# Patient Record
Sex: Female | Born: 1955 | Smoking: Never smoker
Health system: Southern US, Community
[De-identification: ages and names within clinical notes are randomized; demographics above are authoritative.]

## PROBLEM LIST (undated history)

## (undated) DIAGNOSIS — K219 Gastro-esophageal reflux disease without esophagitis: Secondary | ICD-10-CM

## (undated) DIAGNOSIS — D649 Anemia, unspecified: Secondary | ICD-10-CM

---

## 1980-06-20 HISTORY — PX: OTHER SURGICAL HISTORY: SHX169

## 1985-12-21 HISTORY — PX: TUBAL LIGATION: SHX77

## 2012-06-20 HISTORY — PX: LEG SURGERY: SHX1003

## 2021-03-20 DIAGNOSIS — R609 Edema, unspecified: Secondary | ICD-10-CM | POA: Diagnosis not present

## 2021-03-20 DIAGNOSIS — S82142A Displaced bicondylar fracture of left tibia, initial encounter for closed fracture: Secondary | ICD-10-CM | POA: Diagnosis not present

## 2021-03-20 DIAGNOSIS — S8992XA Unspecified injury of left lower leg, initial encounter: Secondary | ICD-10-CM | POA: Diagnosis not present

## 2021-03-20 DIAGNOSIS — S82832A Other fracture of upper and lower end of left fibula, initial encounter for closed fracture: Secondary | ICD-10-CM | POA: Diagnosis not present

## 2021-03-22 DIAGNOSIS — S82142A Displaced bicondylar fracture of left tibia, initial encounter for closed fracture: Secondary | ICD-10-CM | POA: Diagnosis not present

## 2021-03-30 ENCOUNTER — Encounter (HOSPITAL_COMMUNITY): Payer: Self-pay | Admitting: Orthopedic Surgery

## 2021-03-30 ENCOUNTER — Other Ambulatory Visit: Payer: Self-pay

## 2021-03-30 DIAGNOSIS — S82145A Nondisplaced bicondylar fracture of left tibia, initial encounter for closed fracture: Secondary | ICD-10-CM | POA: Diagnosis not present

## 2021-03-30 NOTE — Progress Notes (Signed)
DUE TO COVID-19 ONLY ONE VISITOR IS ALLOWED TO COME WITH YOU AND STAY IN THE WAITING ROOM ONLY DURING PRE OP AND PROCEDURE DAY OF SURGERY.   Two VISITORS MAY VISIT WITH YOU AFTER SURGERY IN YOUR PRIVATE ROOM DURING VISITING HOURS ONLY!  PCP - none Cardiologist - n/a  Chest x-ray - n/a EKG - n/a Stress Test - n/a ECHO - n/a Cardiac Cath - n/a  ICD Pacemaker/Loop - n/a  Sleep Study -  n/a CPAP - none  Aspirin Instructions: Follow your surgeon's instructions on when to stop aspirin prior to surgery,  If no instructions were given by your surgeon then you will need to call the office for those instructions.  STOP now taking any Aspirin (unless otherwise instructed by your surgeon), Aleve, Naproxen, Ibuprofen, Motrin, Advil, Goody's, BC's, all herbal medications, fish oil, and all vitamins.   Coronavirus Screening Covid test is scheduled on DOS Do you have any of the following symptoms:  Cough yes/no: No Fever (>100.34F)  yes/no: No Runny nose yes/no: No Sore throat yes/no: No Difficulty breathing/shortness of breath  yes/no: No  Have you traveled in the last 14 days and where? yes/no: No  Patient verbalized understanding of instructions that were given via phone.

## 2021-03-31 ENCOUNTER — Inpatient Hospital Stay (HOSPITAL_COMMUNITY): Payer: PPO

## 2021-03-31 ENCOUNTER — Encounter (HOSPITAL_COMMUNITY): Payer: Self-pay | Admitting: Orthopedic Surgery

## 2021-03-31 ENCOUNTER — Encounter (HOSPITAL_COMMUNITY)
Admission: RE | Disposition: A | Discharge status: Home Health | Payer: Self-pay | Source: Home / Self Care | Attending: Orthopedic Surgery

## 2021-03-31 ENCOUNTER — Inpatient Hospital Stay (HOSPITAL_COMMUNITY): Payer: PPO | Admitting: Certified Registered Nurse Anesthetist

## 2021-03-31 ENCOUNTER — Inpatient Hospital Stay (HOSPITAL_COMMUNITY)
Admission: RE | Admit: 2021-03-31 | Discharge: 2021-04-01 | DRG: 488 | Disposition: A | Discharge status: Home Health | Payer: PPO | Attending: Orthopedic Surgery | Admitting: Orthopedic Surgery

## 2021-03-31 ENCOUNTER — Other Ambulatory Visit: Payer: Self-pay

## 2021-03-31 DIAGNOSIS — Z91013 Allergy to seafood: Secondary | ICD-10-CM

## 2021-03-31 DIAGNOSIS — T148XXA Other injury of unspecified body region, initial encounter: Secondary | ICD-10-CM

## 2021-03-31 DIAGNOSIS — Z7982 Long term (current) use of aspirin: Secondary | ICD-10-CM | POA: Diagnosis not present

## 2021-03-31 DIAGNOSIS — Z79899 Other long term (current) drug therapy: Secondary | ICD-10-CM

## 2021-03-31 DIAGNOSIS — M80862A Other osteoporosis with current pathological fracture, left lower leg, initial encounter for fracture: Principal | ICD-10-CM | POA: Diagnosis present

## 2021-03-31 DIAGNOSIS — S82142D Displaced bicondylar fracture of left tibia, subsequent encounter for closed fracture with routine healing: Secondary | ICD-10-CM | POA: Diagnosis not present

## 2021-03-31 DIAGNOSIS — D62 Acute posthemorrhagic anemia: Secondary | ICD-10-CM | POA: Diagnosis not present

## 2021-03-31 DIAGNOSIS — M170 Bilateral primary osteoarthritis of knee: Secondary | ICD-10-CM | POA: Diagnosis present

## 2021-03-31 DIAGNOSIS — S83262A Peripheral tear of lateral meniscus, current injury, left knee, initial encounter: Secondary | ICD-10-CM | POA: Diagnosis present

## 2021-03-31 DIAGNOSIS — Z8262 Family history of osteoporosis: Secondary | ICD-10-CM

## 2021-03-31 DIAGNOSIS — S83282A Other tear of lateral meniscus, current injury, left knee, initial encounter: Secondary | ICD-10-CM | POA: Diagnosis not present

## 2021-03-31 DIAGNOSIS — S82142A Displaced bicondylar fracture of left tibia, initial encounter for closed fracture: Secondary | ICD-10-CM

## 2021-03-31 DIAGNOSIS — S82143A Displaced bicondylar fracture of unspecified tibia, initial encounter for closed fracture: Secondary | ICD-10-CM | POA: Diagnosis present

## 2021-03-31 DIAGNOSIS — M80062A Age-related osteoporosis with current pathological fracture, left lower leg, initial encounter for fracture: Secondary | ICD-10-CM

## 2021-03-31 DIAGNOSIS — S82402D Unspecified fracture of shaft of left fibula, subsequent encounter for closed fracture with routine healing: Secondary | ICD-10-CM | POA: Diagnosis not present

## 2021-03-31 DIAGNOSIS — K219 Gastro-esophageal reflux disease without esophagitis: Secondary | ICD-10-CM | POA: Diagnosis not present

## 2021-03-31 DIAGNOSIS — S82122A Displaced fracture of lateral condyle of left tibia, initial encounter for closed fracture: Secondary | ICD-10-CM | POA: Diagnosis not present

## 2021-03-31 DIAGNOSIS — Z20822 Contact with and (suspected) exposure to covid-19: Secondary | ICD-10-CM | POA: Diagnosis not present

## 2021-03-31 DIAGNOSIS — Z882 Allergy status to sulfonamides status: Secondary | ICD-10-CM

## 2021-03-31 DIAGNOSIS — S82112A Displaced fracture of left tibial spine, initial encounter for closed fracture: Secondary | ICD-10-CM | POA: Diagnosis present

## 2021-03-31 DIAGNOSIS — Z885 Allergy status to narcotic agent status: Secondary | ICD-10-CM | POA: Diagnosis not present

## 2021-03-31 DIAGNOSIS — W1830XA Fall on same level, unspecified, initial encounter: Secondary | ICD-10-CM | POA: Diagnosis present

## 2021-03-31 DIAGNOSIS — M8080XA Other osteoporosis with current pathological fracture, unspecified site, initial encounter for fracture: Secondary | ICD-10-CM | POA: Diagnosis present

## 2021-03-31 DIAGNOSIS — Z419 Encounter for procedure for purposes other than remedying health state, unspecified: Secondary | ICD-10-CM

## 2021-03-31 HISTORY — DX: Gastro-esophageal reflux disease without esophagitis: K21.9

## 2021-03-31 HISTORY — DX: Anemia, unspecified: D64.9

## 2021-03-31 HISTORY — PX: ORIF TIBIA PLATEAU: SHX2132

## 2021-03-31 LAB — CBC WITH DIFFERENTIAL/PLATELET
Abs Immature Granulocytes: 0.03 10*3/uL (ref 0.00–0.07)
Basophils Absolute: 0.1 10*3/uL (ref 0.0–0.1)
Basophils Relative: 1 %
Eosinophils Absolute: 0.1 10*3/uL (ref 0.0–0.5)
Eosinophils Relative: 1 %
HCT: 39.4 % (ref 36.0–46.0)
Hemoglobin: 12.6 g/dL (ref 12.0–15.0)
Immature Granulocytes: 0 %
Lymphocytes Relative: 17 %
Lymphs Abs: 1.6 10*3/uL (ref 0.7–4.0)
MCH: 30.9 pg (ref 26.0–34.0)
MCHC: 32 g/dL (ref 30.0–36.0)
MCV: 96.6 fL (ref 80.0–100.0)
Monocytes Absolute: 0.7 10*3/uL (ref 0.1–1.0)
Monocytes Relative: 7 %
Neutro Abs: 7.3 10*3/uL (ref 1.7–7.7)
Neutrophils Relative %: 74 %
Platelets: 391 10*3/uL (ref 150–400)
RBC: 4.08 MIL/uL (ref 3.87–5.11)
RDW: 13.7 % (ref 11.5–15.5)
WBC: 9.7 10*3/uL (ref 4.0–10.5)
nRBC: 0 % (ref 0.0–0.2)

## 2021-03-31 LAB — PREALBUMIN: Prealbumin: 24 mg/dL (ref 18–38)

## 2021-03-31 LAB — COMPREHENSIVE METABOLIC PANEL
ALT: 33 U/L (ref 0–44)
AST: 27 U/L (ref 15–41)
Albumin: 3.4 g/dL — ABNORMAL LOW (ref 3.5–5.0)
Alkaline Phosphatase: 99 U/L (ref 38–126)
Anion gap: 10 (ref 5–15)
BUN: 7 mg/dL — ABNORMAL LOW (ref 8–23)
CO2: 29 mmol/L (ref 22–32)
Calcium: 9.3 mg/dL (ref 8.9–10.3)
Chloride: 104 mmol/L (ref 98–111)
Creatinine, Ser: 0.67 mg/dL (ref 0.44–1.00)
GFR, Estimated: >60 mL/min (ref >60)
Glucose, Bld: 115 mg/dL — ABNORMAL HIGH (ref 70–99)
Potassium: 3.5 mmol/L (ref 3.5–5.1)
Sodium: 143 mmol/L (ref 135–145)
Total Bilirubin: 0.7 mg/dL (ref 0.3–1.2)
Total Protein: 7.1 g/dL (ref 6.5–8.1)

## 2021-03-31 LAB — TSH: TSH: 2.564 u[IU]/mL (ref 0.350–4.500)

## 2021-03-31 LAB — PHOSPHORUS: Phosphorus: 3.3 mg/dL (ref 2.5–4.6)

## 2021-03-31 LAB — MAGNESIUM: Magnesium: 2.2 mg/dL (ref 1.7–2.4)

## 2021-03-31 LAB — SARS CORONAVIRUS 2 BY RT PCR (HOSPITAL ORDER, PERFORMED IN ~~LOC~~ HOSPITAL LAB): SARS Coronavirus 2: NEGATIVE

## 2021-03-31 LAB — VITAMIN D 25 HYDROXY (VIT D DEFICIENCY, FRACTURES): Vit D, 25-Hydroxy: 43.83 ng/mL (ref 30–100)

## 2021-03-31 SURGERY — OPEN REDUCTION INTERNAL FIXATION (ORIF) TIBIAL PLATEAU
Anesthesia: General | Site: Leg Lower | Laterality: Left

## 2021-03-31 MED ORDER — SENNOSIDES-DOCUSATE SODIUM 8.6-50 MG PO TABS: 1.0000 | ORAL_TABLET | Freq: Every evening | ORAL | Status: DC | PRN

## 2021-03-31 MED ORDER — EPHEDRINE 5 MG/ML INJ
INTRAVENOUS | Status: AC
  Filled 2021-03-31: qty 5

## 2021-03-31 MED ORDER — METHOCARBAMOL 750 MG PO TABS
750.0000 mg | ORAL_TABLET | Freq: Three times a day (TID) | ORAL | Status: DC
  Administered 2021-03-31 – 2021-04-01 (×3): 750 mg via ORAL
  Filled 2021-03-31 (×3): qty 1

## 2021-03-31 MED ORDER — METOCLOPRAMIDE HCL 5 MG/ML IJ SOLN: 5.0000 mg | Freq: Three times a day (TID) | INTRAMUSCULAR | Status: DC | PRN

## 2021-03-31 MED ORDER — POTASSIUM CHLORIDE IN NACL 20-0.9 MEQ/L-% IV SOLN
INTRAVENOUS | Status: DC
  Filled 2021-03-31: qty 1000

## 2021-03-31 MED ORDER — SORBITOL 70 % SOLN
30.0000 mL | Freq: Every day | Status: DC | PRN
  Filled 2021-03-31: qty 30

## 2021-03-31 MED ORDER — ROCURONIUM BROMIDE 10 MG/ML (PF) SYRINGE
PREFILLED_SYRINGE | INTRAVENOUS | Status: DC | PRN
  Administered 2021-03-31: 70 mg via INTRAVENOUS

## 2021-03-31 MED ORDER — ENOXAPARIN SODIUM 40 MG/0.4ML IJ SOSY
40.0000 mg | PREFILLED_SYRINGE | INTRAMUSCULAR | Status: DC
  Administered 2021-04-01: 40 mg via SUBCUTANEOUS
  Filled 2021-03-31: qty 0.4

## 2021-03-31 MED ORDER — VITAMIN D 25 MCG (1000 UNIT) PO TABS
2000.0000 [IU] | ORAL_TABLET | Freq: Two times a day (BID) | ORAL | Status: DC
  Administered 2021-03-31 – 2021-04-01 (×3): 2000 [IU] via ORAL
  Filled 2021-03-31 (×3): qty 2

## 2021-03-31 MED ORDER — CEFAZOLIN SODIUM-DEXTROSE 2-4 GM/100ML-% IV SOLN
2.0000 g | INTRAVENOUS | Status: AC
  Administered 2021-03-31: 2 g via INTRAVENOUS
  Filled 2021-03-31: qty 100

## 2021-03-31 MED ORDER — HYDROCODONE-ACETAMINOPHEN 7.5-325 MG PO TABS: 1.0000 | ORAL_TABLET | ORAL | Status: DC | PRN

## 2021-03-31 MED ORDER — PHENYLEPHRINE 40 MCG/ML (10ML) SYRINGE FOR IV PUSH (FOR BLOOD PRESSURE SUPPORT)
PREFILLED_SYRINGE | INTRAVENOUS | Status: DC | PRN
  Administered 2021-03-31: 120 ug via INTRAVENOUS

## 2021-03-31 MED ORDER — METOCLOPRAMIDE HCL 5 MG PO TABS: 5.0000 mg | ORAL_TABLET | Freq: Three times a day (TID) | ORAL | Status: DC | PRN

## 2021-03-31 MED ORDER — ACETAMINOPHEN 500 MG PO TABS
1000.0000 mg | ORAL_TABLET | Freq: Once | ORAL | Status: AC
  Administered 2021-03-31: 1000 mg via ORAL
  Filled 2021-03-31: qty 2

## 2021-03-31 MED ORDER — HYDROCODONE-ACETAMINOPHEN 5-325 MG PO TABS
1.0000 | ORAL_TABLET | ORAL | Status: DC | PRN
  Administered 2021-03-31 – 2021-04-01 (×2): 2 via ORAL
  Filled 2021-03-31 (×2): qty 2

## 2021-03-31 MED ORDER — DEXAMETHASONE SODIUM PHOSPHATE 10 MG/ML IJ SOLN
INTRAMUSCULAR | Status: AC
  Filled 2021-03-31: qty 1

## 2021-03-31 MED ORDER — ASCORBIC ACID 500 MG PO TABS
500.0000 mg | ORAL_TABLET | Freq: Every day | ORAL | Status: DC
  Administered 2021-03-31 – 2021-04-01 (×2): 500 mg via ORAL
  Filled 2021-03-31 (×2): qty 1

## 2021-03-31 MED ORDER — ALUM & MAG HYDROXIDE-SIMETH 200-200-20 MG/5ML PO SUSP: 30.0000 mL | Freq: Four times a day (QID) | ORAL | Status: DC | PRN

## 2021-03-31 MED ORDER — LIDOCAINE 2% (20 MG/ML) 5 ML SYRINGE
INTRAMUSCULAR | Status: DC | PRN
  Administered 2021-03-31: 80 mg via INTRAVENOUS

## 2021-03-31 MED ORDER — FENTANYL CITRATE (PF) 250 MCG/5ML IJ SOLN
INTRAMUSCULAR | Status: DC | PRN
  Administered 2021-03-31: 50 ug via INTRAVENOUS
  Administered 2021-03-31: 100 ug via INTRAVENOUS
  Administered 2021-03-31 (×2): 50 ug via INTRAVENOUS

## 2021-03-31 MED ORDER — LACTATED RINGERS IV SOLN: INTRAVENOUS | Status: DC

## 2021-03-31 MED ORDER — PROMETHAZINE HCL 25 MG/ML IJ SOLN: 6.2500 mg | INTRAMUSCULAR | Status: DC | PRN

## 2021-03-31 MED ORDER — FENTANYL CITRATE (PF) 100 MCG/2ML IJ SOLN
25.0000 ug | INTRAMUSCULAR | Status: DC | PRN
  Administered 2021-03-31 (×2): 25 ug via INTRAVENOUS
  Administered 2021-03-31 (×2): 50 ug via INTRAVENOUS

## 2021-03-31 MED ORDER — METHOCARBAMOL 1000 MG/10ML IJ SOLN
500.0000 mg | Freq: Three times a day (TID) | INTRAVENOUS | Status: DC
  Filled 2021-03-31: qty 5

## 2021-03-31 MED ORDER — PROPOFOL 10 MG/ML IV BOLUS
INTRAVENOUS | Status: DC | PRN
  Administered 2021-03-31: 150 mg via INTRAVENOUS

## 2021-03-31 MED ORDER — ROCURONIUM BROMIDE 10 MG/ML (PF) SYRINGE
PREFILLED_SYRINGE | INTRAVENOUS | Status: AC
  Filled 2021-03-31: qty 10

## 2021-03-31 MED ORDER — ZINC SULFATE 220 (50 ZN) MG PO CAPS
220.0000 mg | ORAL_CAPSULE | Freq: Every day | ORAL | Status: DC
  Administered 2021-03-31 – 2021-04-01 (×2): 220 mg via ORAL
  Filled 2021-03-31 (×2): qty 1

## 2021-03-31 MED ORDER — ONDANSETRON HCL 4 MG/2ML IJ SOLN: 4.0000 mg | Freq: Four times a day (QID) | INTRAMUSCULAR | Status: DC | PRN

## 2021-03-31 MED ORDER — SUGAMMADEX SODIUM 200 MG/2ML IV SOLN
INTRAVENOUS | Status: DC | PRN
  Administered 2021-03-31: 199.4 mg via INTRAVENOUS

## 2021-03-31 MED ORDER — ACETAMINOPHEN 325 MG PO TABS: 325.0000 mg | ORAL_TABLET | Freq: Four times a day (QID) | ORAL | Status: DC | PRN

## 2021-03-31 MED ORDER — ONDANSETRON HCL 4 MG PO TABS: 4.0000 mg | ORAL_TABLET | Freq: Four times a day (QID) | ORAL | Status: DC | PRN

## 2021-03-31 MED ORDER — EPHEDRINE SULFATE-NACL 50-0.9 MG/10ML-% IV SOSY
PREFILLED_SYRINGE | INTRAVENOUS | Status: DC | PRN
  Administered 2021-03-31: 10 mg via INTRAVENOUS

## 2021-03-31 MED ORDER — ONDANSETRON HCL 4 MG/2ML IJ SOLN
INTRAMUSCULAR | Status: AC
  Filled 2021-03-31: qty 2

## 2021-03-31 MED ORDER — FENTANYL CITRATE (PF) 100 MCG/2ML IJ SOLN
INTRAMUSCULAR | Status: AC
  Filled 2021-03-31: qty 2

## 2021-03-31 MED ORDER — ADULT MULTIVITAMIN W/MINERALS CH
1.0000 | ORAL_TABLET | Freq: Every day | ORAL | Status: DC
  Administered 2021-03-31 – 2021-04-01 (×2): 1 via ORAL
  Filled 2021-03-31 (×2): qty 1

## 2021-03-31 MED ORDER — ASPIRIN EC 81 MG PO TBEC
81.0000 mg | DELAYED_RELEASE_TABLET | Freq: Every day | ORAL | Status: DC
  Administered 2021-03-31 – 2021-04-01 (×2): 81 mg via ORAL
  Filled 2021-03-31 (×2): qty 1

## 2021-03-31 MED ORDER — FENTANYL CITRATE (PF) 250 MCG/5ML IJ SOLN
INTRAMUSCULAR | Status: AC
  Filled 2021-03-31: qty 5

## 2021-03-31 MED ORDER — ONDANSETRON HCL 4 MG/2ML IJ SOLN
INTRAMUSCULAR | Status: DC | PRN
Start: 2021-03-31 — End: 2021-03-31
  Administered 2021-03-31: 4 mg via INTRAVENOUS

## 2021-03-31 MED ORDER — ACETAMINOPHEN 500 MG PO TABS
500.0000 mg | ORAL_TABLET | Freq: Two times a day (BID) | ORAL | Status: DC
  Administered 2021-03-31 – 2021-04-01 (×2): 500 mg via ORAL
  Filled 2021-03-31 (×3): qty 1

## 2021-03-31 MED ORDER — AMISULPRIDE (ANTIEMETIC) 5 MG/2ML IV SOLN
10.0000 mg | Freq: Once | INTRAVENOUS | Status: AC | PRN
  Administered 2021-03-31: 10 mg via INTRAVENOUS

## 2021-03-31 MED ORDER — MAGNESIUM HYDROXIDE 400 MG/5ML PO SUSP
30.0000 mL | Freq: Every day | ORAL | Status: DC | PRN
  Filled 2021-03-31: qty 30

## 2021-03-31 MED ORDER — PHENYLEPHRINE HCL-NACL 20-0.9 MG/250ML-% IV SOLN
INTRAVENOUS | Status: DC | PRN
  Administered 2021-03-31: 25 ug/min via INTRAVENOUS

## 2021-03-31 MED ORDER — CHLORHEXIDINE GLUCONATE 0.12 % MT SOLN
OROMUCOSAL | Status: AC
  Administered 2021-03-31: 15 mL
  Filled 2021-03-31: qty 15

## 2021-03-31 MED ORDER — MORPHINE SULFATE (PF) 2 MG/ML IV SOLN: 0.5000 mg | INTRAVENOUS | Status: DC | PRN

## 2021-03-31 MED ORDER — LIDOCAINE 2% (20 MG/ML) 5 ML SYRINGE
INTRAMUSCULAR | Status: AC
  Filled 2021-03-31: qty 5

## 2021-03-31 MED ORDER — 0.9 % SODIUM CHLORIDE (POUR BTL) OPTIME
TOPICAL | Status: DC | PRN
  Administered 2021-03-31: 1000 mL

## 2021-03-31 MED ORDER — DOCUSATE SODIUM 100 MG PO CAPS
100.0000 mg | ORAL_CAPSULE | Freq: Two times a day (BID) | ORAL | Status: DC
  Filled 2021-03-31 (×2): qty 1

## 2021-03-31 MED ORDER — CEFAZOLIN SODIUM-DEXTROSE 1-4 GM/50ML-% IV SOLN
1.0000 g | Freq: Four times a day (QID) | INTRAVENOUS | Status: AC
  Administered 2021-03-31 – 2021-04-01 (×3): 1 g via INTRAVENOUS
  Filled 2021-03-31 (×3): qty 50

## 2021-03-31 MED ORDER — DEXAMETHASONE SODIUM PHOSPHATE 10 MG/ML IJ SOLN
INTRAMUSCULAR | Status: DC | PRN
Start: 2021-03-31 — End: 2021-03-31
  Administered 2021-03-31: 8 mg via INTRAVENOUS

## 2021-03-31 MED ORDER — AMISULPRIDE (ANTIEMETIC) 5 MG/2ML IV SOLN
INTRAVENOUS | Status: AC
  Filled 2021-03-31: qty 4

## 2021-03-31 MED ORDER — PHENYLEPHRINE 40 MCG/ML (10ML) SYRINGE FOR IV PUSH (FOR BLOOD PRESSURE SUPPORT)
PREFILLED_SYRINGE | INTRAVENOUS | Status: AC
  Filled 2021-03-31: qty 10

## 2021-03-31 SURGICAL SUPPLY — 96 items
BAG COUNTER SPONGE SURGICOUNT (BAG) ×2 IMPLANT
BANDAGE ESMARK 6X9 LF (GAUZE/BANDAGES/DRESSINGS) ×1 IMPLANT
BIT DRILL 100X2.5XANTM LCK (BIT) IMPLANT
BIT DRILL CAL (BIT) IMPLANT
BIT DRL 100X2.5XANTM LCK (BIT) ×1
BLADE CLIPPER SURG (BLADE) IMPLANT
BLADE SURG 10 STRL SS (BLADE) ×2 IMPLANT
BLADE SURG 15 STRL LF DISP TIS (BLADE) ×1 IMPLANT
BLADE SURG 15 STRL SS (BLADE) ×1
BNDG COHESIVE 4X5 TAN STRL (GAUZE/BANDAGES/DRESSINGS) ×2 IMPLANT
BNDG ELASTIC 4X5.8 VLCR STR LF (GAUZE/BANDAGES/DRESSINGS) ×2 IMPLANT
BNDG ELASTIC 6X5.8 VLCR STR LF (GAUZE/BANDAGES/DRESSINGS) ×2 IMPLANT
BNDG ESMARK 6X9 LF (GAUZE/BANDAGES/DRESSINGS) ×2
BNDG GAUZE ELAST 4 BULKY (GAUZE/BANDAGES/DRESSINGS) ×2 IMPLANT
BONE CANC CHIPS 20CC PCAN1/4 (Bone Implant) ×2 IMPLANT
BRUSH SCRUB EZ PLAIN DRY (MISCELLANEOUS) ×4 IMPLANT
CANISTER SUCT 3000ML PPV (MISCELLANEOUS) ×2 IMPLANT
CHIPS CANC BONE 20CC PCAN1/4 (Bone Implant) ×1 IMPLANT
COVER SURGICAL LIGHT HANDLE (MISCELLANEOUS) ×2 IMPLANT
CUFF TOURN SGL QUICK 34 (TOURNIQUET CUFF) ×1
CUFF TRNQT CYL 34X4.125X (TOURNIQUET CUFF) ×1 IMPLANT
DRAPE C-ARM 42X72 X-RAY (DRAPES) ×2 IMPLANT
DRAPE C-ARMOR (DRAPES) ×2 IMPLANT
DRAPE HALF SHEET 40X57 (DRAPES) IMPLANT
DRAPE INCISE IOBAN 66X45 STRL (DRAPES) ×2 IMPLANT
DRAPE U-SHAPE 47X51 STRL (DRAPES) ×2 IMPLANT
DRESSING MEPILEX FLEX 4X4 (GAUZE/BANDAGES/DRESSINGS) IMPLANT
DRILL BIT 2.5MM (BIT) ×1
DRILL BIT CAL (BIT) ×2
DRSG ADAPTIC 3X8 NADH LF (GAUZE/BANDAGES/DRESSINGS) ×2 IMPLANT
DRSG MEPILEX BORDER 4X8 (GAUZE/BANDAGES/DRESSINGS) ×1 IMPLANT
DRSG MEPILEX FLEX 4X4 (GAUZE/BANDAGES/DRESSINGS) ×2
DRSG PAD ABDOMINAL 8X10 ST (GAUZE/BANDAGES/DRESSINGS) ×4 IMPLANT
ELECT REM PT RETURN 9FT ADLT (ELECTROSURGICAL) ×2
ELECTRODE REM PT RTRN 9FT ADLT (ELECTROSURGICAL) ×1 IMPLANT
GAUZE SPONGE 4X4 12PLY STRL (GAUZE/BANDAGES/DRESSINGS) ×2 IMPLANT
GLOVE SRG 8 PF TXTR STRL LF DI (GLOVE) ×1 IMPLANT
GLOVE SURG ENC MOIS LTX SZ8 (GLOVE) ×2 IMPLANT
GLOVE SURG ORTHO LTX SZ7.5 (GLOVE) ×4 IMPLANT
GLOVE SURG UNDER POLY LF SZ7.5 (GLOVE) ×2 IMPLANT
GLOVE SURG UNDER POLY LF SZ8 (GLOVE) ×1
GOWN STRL REUS W/ TWL LRG LVL3 (GOWN DISPOSABLE) ×2 IMPLANT
GOWN STRL REUS W/ TWL XL LVL3 (GOWN DISPOSABLE) ×1 IMPLANT
GOWN STRL REUS W/TWL LRG LVL3 (GOWN DISPOSABLE) ×2
GOWN STRL REUS W/TWL XL LVL3 (GOWN DISPOSABLE) ×1
GRAFT BNE CANC CHIPS 1-8 20CC (Bone Implant) IMPLANT
IMMOBILIZER KNEE 22 UNIV (SOFTGOODS) ×2 IMPLANT
K-WIRE ACE 1.6X6 (WIRE) ×14
KIT BASIN OR (CUSTOM PROCEDURE TRAY) ×2 IMPLANT
KIT TURNOVER KIT B (KITS) ×2 IMPLANT
KWIRE ACE 1.6X6 (WIRE) IMPLANT
NDL SUT 6 .5 CRC .975X.05 MAYO (NEEDLE) IMPLANT
NEEDLE MAYO TAPER (NEEDLE) ×1
NS IRRIG 1000ML POUR BTL (IV SOLUTION) ×2 IMPLANT
PACK ORTHO EXTREMITY (CUSTOM PROCEDURE TRAY) ×2 IMPLANT
PAD ARMBOARD 7.5X6 YLW CONV (MISCELLANEOUS) ×4 IMPLANT
PAD CAST 4YDX4 CTTN HI CHSV (CAST SUPPLIES) ×1 IMPLANT
PADDING CAST COTTON 4X4 STRL (CAST SUPPLIES) ×1
PADDING CAST COTTON 6X4 STRL (CAST SUPPLIES) ×2 IMPLANT
PENCIL BUTTON HOLSTER BLD 10FT (ELECTRODE) ×1 IMPLANT
PLATE LOCK 5H STD LT PROX TIB (Plate) ×1 IMPLANT
SCREW CORT FT 32X3.5XNONLOCK (Screw) IMPLANT
SCREW CORTICAL 3.5MM  32MM (Screw) ×1 IMPLANT
SCREW CORTICAL 3.5MM  34MM (Screw) ×1 IMPLANT
SCREW CORTICAL 3.5MM 34MM (Screw) IMPLANT
SCREW CORTICAL 3.5MM 36MM (Screw) ×2 IMPLANT
SCREW LOCK CORT STAR 3.5X54 (Screw) ×1 IMPLANT
SCREW LOCK CORT STAR 3.5X56 (Screw) ×1 IMPLANT
SCREW LOCK CORT STAR 3.5X65 (Screw) ×2 IMPLANT
SCREW LOCK CORT STAR 3.5X70 (Screw) ×3 IMPLANT
SCREW LP 3.5X60MM (Screw) ×1 IMPLANT
SCREW LP 3.5X70MM (Screw) ×1 IMPLANT
SCREW LP 3.5X75MM (Screw) ×1 IMPLANT
SPONGE T-LAP 18X18 ~~LOC~~+RFID (SPONGE) ×2 IMPLANT
STAPLER VISISTAT 35W (STAPLE) ×2 IMPLANT
STOCKINETTE IMPERVIOUS LG (DRAPES) ×2 IMPLANT
SUCTION FRAZIER HANDLE 10FR (MISCELLANEOUS) ×1
SUCTION FRAZIER TIP 8 FR DISP (SUCTIONS) ×1
SUCTION TUBE FRAZIER 10FR DISP (MISCELLANEOUS) ×1 IMPLANT
SUCTION TUBE FRAZIER 8FR DISP (SUCTIONS) IMPLANT
SUT ETHILON 2 0 FS 18 (SUTURE) IMPLANT
SUT PROLENE 0 CT 2 (SUTURE) ×4 IMPLANT
SUT VIC AB 0 CT1 27 (SUTURE) ×1
SUT VIC AB 0 CT1 27XBRD ANBCTR (SUTURE) ×1 IMPLANT
SUT VIC AB 1 CT1 27 (SUTURE) ×1
SUT VIC AB 1 CT1 27XBRD ANBCTR (SUTURE) ×1 IMPLANT
SUT VIC AB 1 CT1 36 (SUTURE) ×1 IMPLANT
SUT VIC AB 2-0 CT1 27 (SUTURE) ×2
SUT VIC AB 2-0 CT1 TAPERPNT 27 (SUTURE) ×2 IMPLANT
SYR 5ML LL (SYRINGE) ×3 IMPLANT
TOWEL GREEN STERILE (TOWEL DISPOSABLE) ×4 IMPLANT
TOWEL GREEN STERILE FF (TOWEL DISPOSABLE) ×2 IMPLANT
TRAY FOLEY MTR SLVR 16FR STAT (SET/KITS/TRAYS/PACK) IMPLANT
TUBE CONNECTING 12X1/4 (SUCTIONS) ×2 IMPLANT
WATER STERILE IRR 1000ML POUR (IV SOLUTION) ×4 IMPLANT
YANKAUER SUCT BULB TIP NO VENT (SUCTIONS) ×2 IMPLANT

## 2021-03-31 NOTE — Progress Notes (Signed)
Orthopedic Tech Progress Note Patient Details:  Alicia Hughes [birth year] [id number] Called in order to [facility] for ROM knee brace Patient ID: Alicia Hughes, female   DOB: 1955/06/12, 66 y.o.   MRN: [id number]  [name] 03/31/2021, 2:11 PM

## 2021-03-31 NOTE — H&P (Signed)
Orthopaedic Trauma Service H&P  Patient ID: Alicia Hughes MRN: [id number] DOB/AGE: 03-21-1955 66 y.o.  Chief Complaint:  Left knee deformity HPI: Alicia Hughes is an 66 y.o. female.who sustained displaced split depressed left tibial plateau fracture in a ground level fall. Presents for surgical repair.  Past Medical History:  Diagnosis Date   Anemia    GERD (gastroesophageal reflux disease)     Past Surgical History:  Procedure Laterality Date   LEG SURGERY Right 06/2012   broken  - 4 pins - spinal   oral maxillofacial surgery  06/1980   in [place]   TUBAL LIGATION  12/1985    History reviewed. No pertinent family history. Social History:  reports that she has never smoked. She has never used smokeless tobacco. She reports that she does not drink alcohol and does not use drugs.  Allergies:  Allergies  Allergen Reactions   Shellfish Allergy Shortness Of Breath    shrimp   Codeine Nausea And Vomiting   Sulfa Antibiotics Rash and Other (See Comments)    burning    Medications Prior to Admission  Medication Sig Dispense Refill   acetaminophen (TYLENOL) 500 MG tablet Take 1,000 mg by mouth every 6 (six) hours as needed (for pain.).     Ascorbic Acid (VITAMIN C GUMMIE PO) Take 2 tablets by mouth in the morning.     aspirin EC 81 MG tablet Take 81 mg by mouth daily. Swallow whole.     CALCIUM PO Take 1 tablet by mouth every evening.     Cholecalciferol (VITAMIN D3 PO) Take 1 tablet by mouth every evening.     Cyanocobalamin (VITAMIN B-12 PO) Take 1 tablet by mouth every evening.     ELDERBERRY PO Take 2 tablets by mouth in the morning.     famotidine (PEPCID) 40 MG tablet Take 40 mg by mouth daily as needed for heartburn or indigestion.     Ferrous Sulfate (IRON PO) Take 1 tablet by mouth every evening.     HYDROcodone-acetaminophen (NORCO/VICODIN) 5-325 MG tablet Take 1 tablet by mouth every 8 (eight) hours as needed for moderate pain.      MAGNESIUM PO Take 1 tablet by mouth every evening.     Misc Natural Products (IMMUNE FORMULA PO) Take 1 capsule by mouth every evening. Immune 24 hr + Vitamin C     Multiple Vitamin (MULTIVITAMIN WITH MINERALS) TABS tablet Take 1 tablet by mouth daily. Centrum Multivitamin for Women 50+     Multiple Vitamins-Minerals (ZINC PO) Take 40 mg by mouth in the morning.     Saline (SIMPLY SALINE) 0.9 % AERS Place 1-2 sprays into the nose as needed (allergies.).      Results for orders placed or performed during the hospital encounter of 03/31/21 (from the past 48 hour(s))  SARS Coronavirus 2 by RT PCR (hospital order, performed in [facility] hospital lab) Nasopharyngeal Nasopharyngeal Swab     Status: None   Collection Time: 03/31/21  6:47 AM   Specimen: Nasopharyngeal Swab  Result Value Ref Range   SARS Coronavirus 2 NEGATIVE NEGATIVE    Comment: (NOTE) SARS-CoV-2 target nucleic acids are NOT DETECTED.  The SARS-CoV-2 RNA is generally detectable in upper and lower respiratory specimens during the acute phase of infection. The lowest concentration of SARS-CoV-2 viral copies this assay can detect is 250 copies / mL. A negative result does not preclude SARS-CoV-2 infection and should not be used as the sole basis for  treatment or other patient management decisions.  A negative result may occur with improper specimen collection / handling, submission of specimen other than nasopharyngeal swab, presence of viral mutation(s) within the areas targeted by this assay, and inadequate number of viral copies (<250 copies / mL). A negative result must be combined with clinical observations, patient history, and epidemiological information.  Fact Sheet for Patients:   BoilerBrush.com.cy  Fact Sheet for Healthcare Providers: https://pope.com/  This test is not yet approved or  cleared by the [place] FDA and has been authorized for detection and/or  diagnosis of SARS-CoV-2 by FDA under an Emergency Use Authorization (EUA).  This EUA will remain in effect (meaning this test can be used) for the duration of the COVID-19 declaration under Section 564(b)(1) of the Act, 21 U.S.C. section 360bbb-3(b)(1), unless the authorization is terminated or revoked sooner.  Performed at [facility], [address], [place], [place] [address]    No results found.  ROS No recent fever, bleeding abnormalities, urologic dysfunction, GI problems, or weight gain.  Blood pressure (!) 148/70, pulse 96, temperature 98.6 F (37 C), temperature source Oral, resp. rate 18, height 5\' 8"  (1.727 m), weight 99.7 kg, SpO2 99 %. Physical Exam NCAT RRR No wheezing, no chest retractions LLE Valgus deformity  Edema/ swelling controlled  Sens: DPN, SPN, TN intact  Motor: EHL, FHL, and lessor toe ext and flex all intact grossly  Brisk cap refill, warm to touch, DP 2+   Assessment/Plan  Left split depressed tibial plateau fracture for ORIF  I discussed with the patient the risks and benefits of surgery, including the possibility of infection, nerve injury, vessel injury, wound breakdown, arthritis, symptomatic hardware, DVT/ PE, loss of motion, malunion, nonunion, and need for further surgery among others.  She acknowledged these risks and wished to proceed.    , MD [facility], [facility] [phone]  03/31/2021, 8:00 AM  [facility] [address] [place] [phone] (F)

## 2021-03-31 NOTE — Transfer of Care (Signed)
Immediate Anesthesia Transfer of Care Note  Patient: Alicia Hughes  Procedure(s) Performed: OPEN REDUCTION INTERNAL FIXATION (ORIF) TIBIAL PLATEAU (Left: Leg Lower)  Patient Location: PACU  Anesthesia Type:General  Level of Consciousness: awake, alert  and oriented  Airway & Oxygen Therapy: Patient Spontanous Breathing  Post-op Assessment: Report given to RN and Post -op Vital signs reviewed and stable  Post vital signs: Reviewed and stable  Last Vitals:  Vitals Value Taken Time  BP 99/62 03/31/21 1103  Temp    Pulse 94 03/31/21 1104  Resp 19 03/31/21 1104  SpO2 93 % 03/31/21 1104  Vitals shown include unvalidated device data.  Last Pain:  Vitals:   03/31/21 0735  TempSrc:   PainSc: 2       Patients Stated Pain Goal: 0 (03/31/21 0735)  Complications: No notable events documented.

## 2021-03-31 NOTE — Anesthesia Preprocedure Evaluation (Addendum)
Anesthesia Evaluation  Patient identified by MRN, date of birth, ID band Patient awake    Reviewed: Allergy & Precautions, NPO status , Patient's Chart, lab work & pertinent test results  Airway Mallampati: II  TM Distance: >3 FB Neck ROM: Full   Comment: Narrow palate Dental no notable dental hx.    Pulmonary neg pulmonary ROS,    Pulmonary exam normal breath sounds clear to auscultation       Cardiovascular Exercise Tolerance: Good negative cardio ROS Normal cardiovascular exam Rhythm:Regular Rate:Normal     Neuro/Psych negative neurological ROS  negative psych ROS   GI/Hepatic Neg liver ROS, GERD  ,  Endo/Other  negative endocrine ROS  Renal/GU negative Renal ROS  negative genitourinary   Musculoskeletal negative musculoskeletal ROS (+)   Abdominal   Peds negative pediatric ROS (+)  Hematology negative hematology ROS (+)   Anesthesia Other Findings   Reproductive/Obstetrics negative OB ROS                            Anesthesia Physical Anesthesia Plan  ASA: 2  Anesthesia Plan: General   Post-op Pain Management: Tylenol PO (pre-op)   Induction: Intravenous  PONV Risk Score and Plan: 3  Airway Management Planned: Oral ETT  Additional Equipment: None  Intra-op Plan:   Post-operative Plan: Extubation in OR  Informed Consent: I have reviewed the patients History and Physical, chart, labs and discussed the procedure including the risks, benefits and alternatives for the proposed anesthesia with the patient or authorized representative who has indicated his/her understanding and acceptance.     Dental advisory given  Plan Discussed with: CRNA, Anesthesiologist and Surgeon  Anesthesia Plan Comments:         Anesthesia Quick Evaluation

## 2021-03-31 NOTE — Progress Notes (Addendum)
° °  Transition of Care [facility]) Screening Note   Patient Details  Name: Alicia Hughes Date of Birth: 10/26/1955   Transition of Care [facility]) CM/SW Contact:    Epifanio Lesches, RN Phone Number: 03/31/2021, 3:25 PM    S/p repair of L tibia plateau fx, 2/9 From home with spouse. Pt works full time. PTA independent with ADL's, no DME usage.  PT/OT  evaluations pending...  Home health orders noted. Pt agreeable to home health services. Pt ok with with any agency in network with insurance. Referral made with [facility], [facility] and [facility].They all declined 2/2 to staffing ... NCM will continue to search. Watching for DME needs.  Transition of Care Department [facility]) has reviewed patient and will continue to monitor patient advancement through interdisciplinary progression rounds. Gae Gallop  RN,BSN,CM  03/31/2021 1600 Referral made with [facility] and accepted.

## 2021-03-31 NOTE — Anesthesia Procedure Notes (Signed)
Procedure Name: Intubation Date/Time: 03/31/2021 8:27 AM Performed by: Minerva Ends, CRNA Pre-anesthesia Checklist: Patient identified, Emergency Drugs available, Suction available and Patient being monitored Patient Re-evaluated:Patient Re-evaluated prior to induction Oxygen Delivery Method: Circle system utilized Preoxygenation: Pre-oxygenation with 100% oxygen Induction Type: IV induction Ventilation: Mask ventilation without difficulty Laryngoscope Size: Mac and 3 Grade View: Grade I Tube type: Oral Tube size: 7.0 mm Number of attempts: 1 Airway Equipment and Method: Stylet Placement Confirmation: ETT inserted through vocal cords under direct vision, positive ETCO2 and breath sounds checked- equal and bilateral Secured at: 19 cm Tube secured with: Tape Dental Injury: Teeth and Oropharynx as per pre-operative assessment

## 2021-03-31 NOTE — Evaluation (Signed)
Physical Therapy Evaluation Patient Details Name: Alicia Hughes MRN: [id number] DOB: [birth year] Today's Date: 03/31/2021  History of Present Illness  Alicia Hughes is an 66 y.o. female.who sustained displaced split depressed left tibial plateau fracture in a ground level fall. Under went surgical repair. PMHx: Anemia, GERD, R leg surgery from fracture, oral maxillofacial surgery  Clinical Impression  Pt was unable to move her left LE in the bed against gravity.Pt was able to mobilize in bed with min guard to min assist to minimize pain and manage left LE. Patient ambulated in the room with min guard and the walker. She adhered to her precautions. During physical exam patient showed deficits in strength, endurance, activity tolerance. Pt has help available to her between her husband that lives with her and various family members who live on her street. Recommending therapy services with home health services to address the previously stated deficits. Will continue to follow acutely to maximize functional mobility, independence and safety.        Recommendations for follow up therapy are one component of a multi-disciplinary discharge planning process, led by the attending physician.  Recommendations may be updated based on patient status, additional functional criteria and insurance authorization.  Follow Up Recommendations Home health PT    Assistance Recommended at Discharge Set up Supervision/Assistance  Patient can return home with the following  A little help with walking and/or transfers;A little help with bathing/dressing/bathroom;Assistance with cooking/housework;Assist for transportation;Help with stairs or ramp for entrance    Equipment Recommendations None recommended by PT  Recommendations for Other Services       Functional Status Assessment Patient has had a recent decline in their functional status and demonstrates the ability to make significant improvements in function in a  reasonable and predictable amount of time.     Precautions / Restrictions Precautions Precautions: Knee Precaution Booklet Issued: No Required Braces or Orthoses: Knee Immobilizer - Left Knee Immobilizer - Left: On when out of bed or walking Restrictions Weight Bearing Restrictions: Yes LLE Weight Bearing: Non weight bearing Other Position/Activity Restrictions: Patient was able to explain precautions.      Mobility  Bed Mobility Overal bed mobility: Needs Assistance Bed Mobility: Supine to Sit, Sit to Supine     Supine to sit: Min guard Sit to supine: Min assist   General bed mobility comments: Needs extra time. Min guard assist to minimize pain with LE mobility. Pt was Min assist from sit to supine to help manage L LE.    Transfers Overall transfer level: Needs assistance   Transfers: Sit to/from Stand Sit to Stand: Min guard           General transfer comment: Pt was able to transfer with min guard for safety and line management.    Ambulation/Gait Ambulation/Gait assistance: Min guard Gait Distance (Feet): 12 Feet Assistive device: Rolling walker (2 wheels) Gait Pattern/deviations: Step-to pattern       General Gait Details: Pt is non weightbearing on her left leg and was able to ambulate safely without putting her L foot on the ground.  Stairs            Wheelchair Mobility    Modified Rankin (Stroke Patients Only)       Balance Overall balance assessment: Modified Independent  Pertinent Vitals/Pain Pain Assessment Pain Assessment: 0-10 Pain Score: 5  Pain Location: L knee    Home Living Family/patient expects to be discharged to:: Private residence Living Arrangements: Spouse/significant other Available Help at Discharge: Family;Friend(s);Neighbor;Available 24 hours/day Type of Home: Mobile home Home Access: Stairs to enter;Ramped entrance Entrance Stairs-Rails:  None Entrance Stairs-Number of Steps: 1   Home Layout: One level Home Equipment: Agricultural consultant (2 wheels);Rollator (4 wheels);Standard Walker;Cane - quad;BSC/3in1;Shower seat - built in      Prior Function Prior Level of Function : Independent/Modified Independent             Mobility Comments: Works at [employer], desk job.       Hand Dominance   Dominant Hand: Right    Extremity/Trunk Assessment   Upper Extremity Assessment Upper Extremity Assessment: Overall WFL for tasks assessed    Lower Extremity Assessment Lower Extremity Assessment: LLE deficits/detail LLE Deficits / Details: Post surgical ROM and strength deficits. LLE: Unable to fully assess due to pain;Unable to fully assess due to immobilization       Communication   Communication: No difficulties  Cognition Arousal/Alertness: Awake/alert Behavior During Therapy: WFL for tasks assessed/performed Overall Cognitive Status: Within Functional Limits for tasks assessed                                          General Comments General comments (skin integrity, edema, etc.): Pt was able to balance on one leg and ambulate with a rolling walker.    Exercises     Assessment/Plan    PT Assessment Patient needs continued PT services  PT Problem List Decreased strength;Decreased mobility;Decreased range of motion;Decreased activity tolerance;Decreased cognition;Decreased knowledge of use of DME       PT Treatment Interventions DME instruction;Gait training;Stair training;Functional mobility training;Therapeutic activities;Patient/family education;Neuromuscular re-education;Balance training;Therapeutic exercise    PT Goals (Current goals can be found in the Care Plan section)       Frequency Min 5X/week     Co-evaluation               AM-PAC PT "6 Clicks" Mobility  Outcome Measure Help needed turning from your back to your side while in a flat bed without using  bedrails?: None Help needed moving from lying on your back to sitting on the side of a flat bed without using bedrails?: None Help needed moving to and from a bed to a chair (including a wheelchair)?: A Little Help needed standing up from a chair using your arms (e.g., wheelchair or bedside chair)?: A Little Help needed to walk in hospital room?: A Little Help needed climbing 3-5 steps with a railing? : A Lot 6 Click Score: 19    End of Session Equipment Utilized During Treatment: Gait belt;Left knee immobilizer Activity Tolerance: Patient tolerated treatment well Patient left: in bed;with call bell/phone within reach Nurse Communication: Mobility status PT Visit Diagnosis: Other abnormalities of gait and mobility (R26.89);Muscle weakness (generalized) (M62.81);Pain Pain - Right/Left: Left Pain - part of body: Leg    Time: 0981-1914 PT Time Calculation (min) (ACUTE ONLY): 32 min   Charges:   PT Evaluation $PT Eval Low Complexity: 1 Low          Armanda Heritage, SPT   Armanda Heritage 03/31/2021, 5:05 PM

## 2021-04-01 ENCOUNTER — Encounter (HOSPITAL_COMMUNITY): Payer: Self-pay | Admitting: Orthopedic Surgery

## 2021-04-01 ENCOUNTER — Other Ambulatory Visit (HOSPITAL_COMMUNITY): Payer: Self-pay

## 2021-04-01 DIAGNOSIS — K219 Gastro-esophageal reflux disease without esophagitis: Secondary | ICD-10-CM

## 2021-04-01 DIAGNOSIS — M8080XA Other osteoporosis with current pathological fracture, unspecified site, initial encounter for fracture: Secondary | ICD-10-CM

## 2021-04-01 HISTORY — DX: Other osteoporosis with current pathological fracture, unspecified site, initial encounter for fracture: M80.80XA

## 2021-04-01 LAB — CBC
HCT: 33.7 % — ABNORMAL LOW (ref 36.0–46.0)
Hemoglobin: 10.8 g/dL — ABNORMAL LOW (ref 12.0–15.0)
MCH: 31 pg (ref 26.0–34.0)
MCHC: 32 g/dL (ref 30.0–36.0)
MCV: 96.8 fL (ref 80.0–100.0)
Platelets: 352 10*3/uL (ref 150–400)
RBC: 3.48 MIL/uL — ABNORMAL LOW (ref 3.87–5.11)
RDW: 14 % (ref 11.5–15.5)
WBC: 13.3 10*3/uL — ABNORMAL HIGH (ref 4.0–10.5)
nRBC: 0 % (ref 0.0–0.2)

## 2021-04-01 LAB — BASIC METABOLIC PANEL WITH GFR
Anion gap: 6 (ref 5–15)
BUN: 6 mg/dL — ABNORMAL LOW (ref 8–23)
CO2: 29 mmol/L (ref 22–32)
Calcium: 8.7 mg/dL — ABNORMAL LOW (ref 8.9–10.3)
Chloride: 103 mmol/L (ref 98–111)
Creatinine, Ser: 0.67 mg/dL (ref 0.44–1.00)
GFR, Estimated: >60 mL/min
Glucose, Bld: 116 mg/dL — ABNORMAL HIGH (ref 70–99)
Potassium: 4.5 mmol/L (ref 3.5–5.1)
Sodium: 138 mmol/L (ref 135–145)

## 2021-04-01 LAB — CALCIUM, IONIZED: Calcium, Ionized, Serum: 5.3 mg/dL (ref 4.5–5.6)

## 2021-04-01 LAB — GLUCOSE, CAPILLARY: Glucose-Capillary: 174 mg/dL — ABNORMAL HIGH (ref 70–99)

## 2021-04-01 MED ORDER — ACETAMINOPHEN 500 MG PO TABS
500.0000 mg | ORAL_TABLET | Freq: Two times a day (BID) | ORAL | 0 refills | Status: AC
Start: 2021-04-01 — End: ?
  Filled 2021-04-01: qty 60, 30d supply, fill #0

## 2021-04-01 MED ORDER — DOCUSATE SODIUM 100 MG PO CAPS
100.0000 mg | ORAL_CAPSULE | Freq: Two times a day (BID) | ORAL | 0 refills | Status: AC
Start: 2021-04-01 — End: ?
  Filled 2021-04-01: qty 10, 5d supply, fill #0

## 2021-04-01 MED ORDER — METHOCARBAMOL 750 MG PO TABS
750.0000 mg | ORAL_TABLET | Freq: Three times a day (TID) | ORAL | 0 refills | Status: AC | PRN
Start: 2021-04-01 — End: ?
  Filled 2021-04-01: qty 30, 10d supply, fill #0

## 2021-04-01 MED ORDER — HYDROCODONE-ACETAMINOPHEN 5-325 MG PO TABS
1.0000 | ORAL_TABLET | Freq: Three times a day (TID) | ORAL | 0 refills | Status: AC | PRN
  Filled 2021-04-01: qty 40, 7d supply, fill #0

## 2021-04-01 MED ORDER — RIVAROXABAN 10 MG PO TABS
10.0000 mg | ORAL_TABLET | Freq: Every day | ORAL | 0 refills | Status: AC
  Filled 2021-04-01: qty 30, 30d supply, fill #0

## 2021-04-01 NOTE — Discharge Instructions (Signed)
Orthopaedic Trauma Service Discharge Instructions   General Discharge Instructions  Orthopaedic Injuries:  Left tibial plateau fracture treated with open reduction and internal fixation using plate and screws  WEIGHT BEARING STATUS: Nonweightbearing left leg.  Use walker to mobilize  RANGE OF MOTION/ACTIVITY: Unrestricted range of motion of left knee and ankle.  Please continue to do home exercises that therapy talk to.  Also use blue bone foam to help maintain knee in full extension while at rest.  Activity as tolerated while maintaining weightbearing restrictions noted above  Bone health: Vitamin D levels look good however fracture pattern is suggestive of a fragility fracture.  Recommend bone density scan in the next 4 to 8 weeks.  You will also be referred to osteoporosis clinic to assess for additional treatment options.  Continue with your vitamin D, calcium and magnesium supplementation  Review the following resource for additional information regarding bone health  [email or web address]  Wound Care: Daily wound care starting on 04/02/2021.  Can use 4 x 4 gauze and tape or silicone foam dressing such as Mepilex which is what you have on currently.  Continue to use your compression socks as well.  You may remove your compression socks at night but placed them back on first thing in the morning  DVT/PE prophylaxis: Xarelto 10 mg daily for the next 30 days  Diet: as you were eating previously.  Can use over the counter stool softeners and bowel preparations, such as Miralax, to help with bowel movements.  Narcotics can be constipating.  Be sure to drink plenty of fluids  PAIN MEDICATION USE AND EXPECTATIONS  You have likely been given narcotic medications to help control your pain.  After a traumatic event that results in an fracture (broken bone) with or without surgery, it is ok to use narcotic pain medications to help control one's pain.  We understand that  everyone responds to pain differently and each individual patient will be evaluated on a regular basis for the continued need for narcotic medications. Ideally, narcotic medication use should last no more than 6-8 weeks (coinciding with fracture healing).   As a patient it is your responsibility as well to monitor narcotic medication use and report the amount and frequency you use these medications when you come to your office visit.   We would also advise that if you are using narcotic medications, you should take a dose prior to therapy to maximize you participation.  IF YOU ARE ON NARCOTIC MEDICATIONS IT IS NOT PERMISSIBLE TO OPERATE A MOTOR VEHICLE (MOTORCYCLE/CAR/TRUCK/MOPED) OR HEAVY MACHINERY DO NOT MIX NARCOTICS WITH OTHER CNS (CENTRAL NERVOUS SYSTEM) DEPRESSANTS SUCH AS ALCOHOL   POST-OPERATIVE OPIOID TAPER INSTRUCTIONS: It is important to wean off of your opioid medication as soon as possible. If you do not need pain medication after your surgery it is ok to stop day one. Opioids include: Codeine, Hydrocodone(Norco, Vicodin), Oxycodone(Percocet, oxycontin) and hydromorphone amongst others.  Long term and even short term use of opiods can cause: Increased pain response Dependence Constipation Depression Respiratory depression And more.  Withdrawal symptoms can include Flu like symptoms Nausea, vomiting And more Techniques to manage these symptoms Hydrate well Eat regular healthy meals Stay active Use relaxation techniques(deep breathing, meditating, yoga) Do Not substitute Alcohol to help with tapering If you have been on opioids for less than two weeks and do not have pain than it is ok to stop all together.  Plan to wean off of opioids This plan should start within one week  post op of your fracture surgery  Maintain the same interval or time between taking each dose and first decrease the dose.  Cut the total daily intake of opioids by one tablet each day Next start to  increase the time between doses. The last dose that should be eliminated is the evening dose.    STOP SMOKING OR USING NICOTINE PRODUCTS!!!!  As discussed nicotine severely impairs your body's ability to heal surgical and traumatic wounds but also impairs bone healing.  Wounds and bone heal by forming microscopic blood vessels (angiogenesis) and nicotine is a vasoconstrictor (essentially, shrinks blood vessels).  Therefore, if vasoconstriction occurs to these microscopic blood vessels they essentially disappear and are unable to deliver necessary nutrients to the healing tissue.  This is one modifiable factor that you can do to dramatically increase your chances of healing your injury.    (This means no smoking, no nicotine gum, patches, etc)  DO NOT USE NONSTEROIDAL ANTI-INFLAMMATORY DRUGS (NSAID'S)  Using products such as Advil (ibuprofen), Aleve (naproxen), Motrin (ibuprofen) for additional pain control during fracture healing can delay and/or prevent the healing response.  If you would like to take over the counter (OTC) medication, Tylenol (acetaminophen) is ok.  However, some narcotic medications that are given for pain control contain acetaminophen as well. Therefore, you should not exceed more than 4000 mg of tylenol in a day if you do not have liver disease.  Also note that there are may OTC medicines, such as cold medicines and allergy medicines that my contain tylenol as well.  If you have any questions about medications and/or interactions please ask your doctor/PA or your pharmacist.      ICE AND ELEVATE INJURED/OPERATIVE EXTREMITY  Using ice and elevating the injured extremity above your heart can help with swelling and pain control.  Icing in a pulsatile fashion, such as 20 minutes on and 20 minutes off, can be followed.    Do not place ice directly on skin. Make sure there is a barrier between to skin and the ice pack.    Using frozen items such as frozen peas works well as the conform  nicely to the are that needs to be iced.  USE AN ACE WRAP OR TED HOSE FOR SWELLING CONTROL  In addition to icing and elevation, Ace wraps or TED hose are used to help limit and resolve swelling.  It is recommended to use Ace wraps or TED hose until you are informed to stop.    When using Ace Wraps start the wrapping distally (farthest away from the body) and wrap proximally (closer to the body)   Example: If you had surgery on your leg or thing and you do not have a splint on, start the ace wrap at the toes and work your way up to the thigh        If you had surgery on your upper extremity and do not have a splint on, start the ace wrap at your fingers and work your way up to the upper arm  IF YOU ARE IN A SPLINT OR CAST DO NOT REMOVE IT FOR ANY REASON   If your splint gets wet for any reason please contact the office immediately. You may shower in your splint or cast as long as you keep it dry.  This can be done by wrapping in a cast cover or garbage back (or similar)  Do Not stick any thing down your splint or cast such as pencils, money, or hangers  to try and scratch yourself with.  If you feel itchy take benadryl as prescribed on the bottle for itching  IF YOU ARE IN A CAM BOOT (BLACK BOOT)  You may remove boot periodically. Perform daily dressing changes as noted below.  Wash the liner of the boot regularly and wear a sock when wearing the boot. It is recommended that you sleep in the boot until told otherwise    Call office for the following: Temperature greater than 101F Persistent nausea and vomiting Severe uncontrolled pain Redness, tenderness, or signs of infection (pain, swelling, redness, odor or green/yellow discharge around the site) Difficulty breathing, headache or visual disturbances Hives Persistent dizziness or light-headedness Extreme fatigue Any other questions or concerns you may have after discharge  In an emergency, call 911 or go to an Emergency Department at a  nearby hospital  HELPFUL INFORMATION  If you had a block, it will wear off between 8-24 hrs postop typically.  This is period when your pain may go from nearly zero to the pain you would have had postop without the block.  This is an abrupt transition but nothing dangerous is happening.  You may take an extra dose of narcotic when this happens.  You should wean off your narcotic medicines as soon as you are able.  Most patients will be off or using minimal narcotics before their first postop appointment.   We suggest you use the pain medication the first night prior to going to bed, in order to ease any pain when the anesthesia wears off. You should avoid taking pain medications on an empty stomach as it will make you nauseous.  Do not drink alcoholic beverages or take illicit drugs when taking pain medications.  In most states it is against the law to drive while you are in a splint or sling.  And certainly against the law to drive while taking narcotics.  You may return to work/school in the next couple of days when you feel up to it.   Pain medication may make you constipated.  Below are a few solutions to try in this order: Decrease the amount of pain medication if you arent having pain. Drink lots of decaffeinated fluids. Drink prune juice and/or each dried prunes  If the first 3 dont work start with additional solutions Take Colace - an over-the-counter stool softener Take Senokot - an over-the-counter laxative Take Miralax - a stronger over-the-counter laxative     CALL THE OFFICE WITH ANY QUESTIONS OR CONCERNS: [phone]   VISIT OUR WEBSITE FOR ADDITIONAL INFORMATION: [email or web address]

## 2021-04-01 NOTE — Anesthesia Postprocedure Evaluation (Signed)
Anesthesia Post Note  Patient: Alicia Hughes  Procedure(s) Performed: OPEN REDUCTION INTERNAL FIXATION (ORIF) TIBIAL PLATEAU (Left: Leg Lower)     Patient location during evaluation: PACU Anesthesia Type: General Level of consciousness: awake Pain management: pain level controlled Vital Signs Assessment: post-procedure vital signs reviewed and stable Respiratory status: spontaneous breathing and respiratory function stable Cardiovascular status: stable Postop Assessment: no apparent nausea or vomiting Anesthetic complications: no   No notable events documented.  Last Vitals:  Vitals:   03/31/21 2210 04/01/21 0544  BP: 104/65 117/66  Pulse: 99 94  Resp: 17 19  Temp: 36.8 C 37.2 C  SpO2: 96% 98%    Last Pain:  Vitals:   04/01/21 0645  TempSrc:   PainSc: 3                  Candra R Winner Valeriano

## 2021-04-01 NOTE — Progress Notes (Signed)
Orthopaedic Trauma Service Progress Note  Patient ID: Alicia Hughes MRN: [id number] DOB/AGE: 1956/01/14 66 y.o.  Subjective:  Doing well No specific complaints  Pain controlled  Wants to go home today   Has all equipment she needs already   Reports no PCP No previous DEXA  Takes OTC vitamin d, calcium, magnesium  No other meds in past for osteoporosis  Mother and sister with osteoporosis   ROS As above  Objective:   VITALS:   Vitals:   03/31/21 1428 03/31/21 2210 04/01/21 0544 04/01/21 0827  BP:  104/65 117/66 109/61  Pulse: 97 99 94 (!) 102  Resp:  17 19 20   Temp:  98.2 F (36.8 C) 98.9 F (37.2 C) 98.1 F (36.7 C)  TempSrc:  Oral Oral Oral  SpO2: 95% 96% 98% 95%  Weight:      Height:        Estimated body mass index is 33.42 kg/m as calculated from the following:   Height as of this encounter: 5\' 8"  (1.727 m).   Weight as of this encounter: 99.7 kg.   Intake/Output      02/09 0701 02/10 0700 02/10 0701 02/11 0700   P.O. 360    I.V. (mL/kg) 1570 (15.7)    IV Piggyback 50    Total Intake(mL/kg) 1980 (19.9)    Urine (mL/kg/hr) 600 (0.3)    Blood 100    Total Output 700    Net +1280         Urine Occurrence 5 x      LABS  Results for orders placed or performed during the hospital encounter of 03/31/21 (from the past 24 hour(s))  Basic metabolic panel     Status: Abnormal   Collection Time: 04/01/21  2:57 AM  Result Value Ref Range   Sodium 138 135 - 145 mmol/L   Potassium 4.5 3.5 - 5.1 mmol/L   Chloride 103 98 - 111 mmol/L   CO2 29 22 - 32 mmol/L   Glucose, Bld 116 (H) 70 - 99 mg/dL   BUN 6 (L) 8 - 23 mg/dL   Creatinine, Ser 05/29/21 0.44 - 1.00 mg/dL   Calcium 8.7 (L) 8.9 - 10.3 mg/dL   GFR, Estimated 05/30/21 0.96 mL/min   Anion gap 6 5 - 15  CBC     Status: Abnormal   Collection Time: 04/01/21  2:57 AM  Result Value Ref Range   WBC 13.3 (H) 4.0 - 10.5 K/uL   RBC 3.48  (L) 3.87 - 5.11 MIL/uL   Hemoglobin 10.8 (L) 12.0 - 15.0 g/dL   HCT >36 (L) 05/30/21 - 62.9 %   MCV 96.8 80.0 - 100.0 fL   MCH 31.0 26.0 - 34.0 pg   MCHC 32.0 30.0 - 36.0 g/dL   RDW 47.6 54.6 - 50.3 %   Platelets 352 150 - 400 K/uL   nRBC 0.0 0.0 - 0.2 %  Glucose, capillary     Status: Abnormal   Collection Time: 04/01/21  8:07 AM  Result Value Ref Range   Glucose-Capillary 174 (H) 70 - 99 mg/dL    Latest Reference Range & Units 03/31/21 07:20  Vitamin D, 25-Hydroxy 30 - 100 ng/mL 43.83     PHYSICAL EXAM:   Gen: sitting up in bed, NAD, appears well, very pleasant  Lungs:  CTA B  Cardiac: RRR Abd: + BS, NTND Ext:       Left Lower Extremity   Dressings stable  TED hose in place  Minimal swelling  Ext warm   + DP pulse  No DCT   Compartments are soft  DPN, SPN, TN sensation inact  Ankle flexion, extension, inversion and eversion are intact   Knee does not come in to full extension, nor does her contra-lateral side which also had a tibial plateau fracture in 2014   She does not recall if her L knee went into full extension prior to this recent fracture   Assessment/Plan: 1 Day Post-Op   Principal Problem:   Closed bicondylar fracture of left tibial plateau Active Problems:   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 03/31/21 9012 03/31/21 0841     .  POD/HD#: 73  66 year old female ground-level fall with left tibial plateau fracture  -fall  -Left bicondylar tibial plateau fracture with significant split depression of the lateral plateau s/p ORIF left tibial plateau  Nonweightbearing 6 weeks---> use  walker for mobilization  Unrestricted range of motion left knee   Hinged knee brace only needs to be on when mobilizing  Therapy evaluations  Dressing changes starting on 04/02/2021   Can use 4 x 4 gauze and tape or silicone foam dressing such as a Mepilex.  Continue to use compression socks   Ice and elevate for swelling and pain control  Zero knee bone foam    Patient is unsure of her left knee went into full extension prior to her injury.  She does have fairly significant arthritis so it is possible that she was lacking full extension.  Her contralateral side does not go into full extension   Follow-up with orthopedics in 10 to 14 days for follow-up x-rays and suture removal   May consider allowing her to get into the pool at the 4-week mark to begin some water-based weightbearing exercises  - Pain management:  Multimodal  - ABL anemia/Hemodynamics  Stable  - Medical issues   No reported chronic medical issues  - DVT/PE prophylaxis:  Xarelto 10 mg daily x 30 days - ID:   Periop abx   - Metabolic Bone Disease:  Fracture mechanism is suggestive of osteoporosis.  This is a fragility type fracture  Patient will need a bone density scan in 4 to 8 weeks.  We will refer her to osteoporosis clinic after obtaining DEXA scan.  Continue with vitamin D, calcium, magnesium  - Activity:  As above  - FEN/GI prophylaxis/Foley/Lines:  Reg diet  Dc IV and IVF  - Impediments to fracture healing:  Poor bone quality/osteoporosis   - Dispo:  Home today after therapies  Follow up with ortho in 10-14 days  Outpt referral for osteoporosis       Mearl Latin, PA-C [phone] (C) 04/01/2021, 10:26 AM  [facility] [address] [place] [address] [phone] [phone] (F)    After 5pm and on the weekends please  log on to Amion, go to orthopaedics and the look under the [facility] Call for the provider(s) on call. You can also call our  office at [phone] and then follow the prompts to be connected to the call team.   Patient ID: Alicia Hughes, female   DOB: 1955/05/24, 66 y.o.   MRN: [id number]

## 2021-04-01 NOTE — Plan of Care (Signed)

## 2021-04-01 NOTE — Progress Notes (Signed)
Orthopedic Tech Progress Note Patient Details:  Alicia Hughes [birth year] [id number]  Ortho Devices Type of Ortho Device: Bone foam zero knee Ortho Device/Splint Interventions: Ordered      Danton Sewer A Radley Barto 04/01/2021, 1:49 PM

## 2021-04-01 NOTE — Evaluation (Signed)
Occupational Therapy Evaluation Patient Details Name: Alicia Hughes MRN: [id number] DOB: Aug 19, 1955 Today's Date: 04/01/2021   History of Present Illness Alicia Hughes is an 66 y.o. female.who sustained displaced split depressed left tibial plateau fracture in a ground level fall. Under went surgical repair. PMHx: Anemia, GERD, R leg surgery from fracture, oral maxillofacial surgery   Clinical Impression   Pt admitted with the above diagnoses and presents with below problem list. Pt will benefit from continued acute OT to address the below listed deficits and maximize independence with basic ADLs prior to d/c home. At baseline, pt is mod I with ADLs, works a Office manager. Pt currently needs up to min A with LB ADLs, min guard with toilet transfers and functional mobility.       Recommendations for follow up therapy are one component of a multi-disciplinary discharge planning process, led by the attending physician.  Recommendations may be updated based on patient status, additional functional criteria and insurance authorization.   Follow Up Recommendations  No OT follow up    Assistance Recommended at Discharge Intermittent Supervision/Assistance  Patient can return home with the following A little help with walking and/or transfers;A little help with bathing/dressing/bathroom;Assist for transportation;Assistance with cooking/housework    Functional Status Assessment  Patient has had a recent decline in their functional status and demonstrates the ability to make significant improvements in function in a reasonable and predictable amount of time.  Equipment Recommendations  None recommended by OT    Recommendations for Other Services       Precautions / Restrictions Precautions Precautions: Knee Precaution Booklet Issued: No Required Braces or Orthoses: Hinged knee brace only needs to be on when mobilizing Weight Bearing Restrictions: Yes LLE Weight Bearing: Non weight bearing       Mobility Bed Mobility               General bed mobility comments: not observed    Transfers Overall transfer level: Needs assistance Equipment used: Rolling walker (2 wheels) Transfers: Sit to/from Stand Sit to Stand: Min guard           General transfer comment: stedying assist. cues for technique with rw. to/from EOB, 3n1 over toilet, and recliner heights.      Balance Overall balance assessment: Needs assistance Sitting-balance support: No upper extremity supported, Feet supported Sitting balance-Leahy Scale: Fair     Standing balance support: Bilateral upper extremity supported, During functional activity, Reliant on assistive device for balance                               ADL either performed or assessed with clinical judgement   ADL Overall ADL's : Needs assistance/impaired Eating/Feeding: Set up;Sitting   Grooming: Set up;Min guard;Sitting;Standing Grooming Details (indicate cue type and reason): pt leaning on sink to wash hands in standing position Upper Body Bathing: Set up;Sitting   Lower Body Bathing: Minimal assistance;Sit to/from stand   Upper Body Dressing : Set up;Sitting   Lower Body Dressing: Minimal assistance;Sit to/from stand   Toilet Transfer: Min guard;Ambulation;Rolling walker (2 wheels);BSC/3in1 Toilet Transfer Details (indicate cue type and reason): 3n1 over toilet Toileting- Clothing Manipulation and Hygiene: Min guard;Sit to/from stand;Sitting/lateral lean;Minimal assistance       Functional mobility during ADLs: Min guard;Rolling walker (2 wheels) General ADL Comments: Cues for technique with functional transfers, tendency to sit a bit prematurely. Discussed shower transfers at home, pt has tub bench. Spouse able  to help manage donning/doffing brace and LB ADLs as needed.     Vision         Perception     Praxis      Pertinent Vitals/Pain Pain Assessment Pain Assessment: Faces Faces Pain Scale: Hurts  little more Pain Location: L knee Pain Descriptors / Indicators: Grimacing, Guarding Pain Intervention(s): Limited activity within patient's tolerance, Monitored during session, Repositioned, Ice applied     Hand Dominance Right   Extremity/Trunk Assessment Upper Extremity Assessment Upper Extremity Assessment: Overall WFL for tasks assessed   Lower Extremity Assessment Lower Extremity Assessment: Defer to PT evaluation       Communication Communication Communication: No difficulties   Cognition Arousal/Alertness: Awake/alert Behavior During Therapy: WFL for tasks assessed/performed Overall Cognitive Status: Within Functional Limits for tasks assessed                                       General Comments       Exercises     Shoulder Instructions      Home Living Family/patient expects to be discharged to:: Private residence Living Arrangements: Spouse/significant other Available Help at Discharge: Family;Friend(s);Neighbor;Available 24 hours/day Type of Home: Mobile home Home Access: Stairs to enter;Ramped entrance Entrance Stairs-Number of Steps: 1 Entrance Stairs-Rails: None Home Layout: One level     Bathroom Shower/Tub: Teacher, early years/pre: Standard Bathroom Accessibility: Yes   Home Equipment: Conservation officer, nature (2 wheels);Rollator (4 wheels);Standard Walker;Cane - quad;BSC/3in1;Shower seat - built in          Prior Functioning/Environment Prior Level of Function : Independent/Modified Independent             Mobility Comments: Works at [employer], desk job.          OT Problem List: Impaired balance (sitting and/or standing);Decreased knowledge of use of DME or AE;Decreased knowledge of precautions;Pain      OT Treatment/Interventions: Self-care/ADL training;DME and/or AE instruction;Therapeutic exercise;Therapeutic activities;Patient/family education;Balance training    OT Goals(Current goals can be found  in the care plan section) Acute Rehab OT Goals Patient Stated Goal: home OT Goal Formulation: With patient Time For Goal Achievement: 04/15/21 Potential to Achieve Goals: Good ADL Goals Pt Will Perform Lower Body Bathing: with modified independence;sit to/from stand Pt Will Perform Lower Body Dressing: with modified independence;sit to/from stand Pt Will Transfer to Toilet: with modified independence;ambulating Pt Will Perform Toileting - Clothing Manipulation and hygiene: with modified independence;sit to/from stand Pt Will Perform Tub/Shower Transfer: Tub transfer;with min guard assist;tub bench;rolling walker  OT Frequency: Min 2X/week    Co-evaluation              AM-PAC OT "6 Clicks" Daily Activity     Outcome Measure Help from another person eating meals?: None Help from another person taking care of personal grooming?: A Little Help from another person toileting, which includes using toliet, bedpan, or urinal?: A Little Help from another person bathing (including washing, rinsing, drying)?: A Little Help from another person to put on and taking off regular upper body clothing?: None Help from another person to put on and taking off regular lower body clothing?: A Little 6 Click Score: 20   End of Session Equipment Utilized During Treatment: Rolling walker (2 wheels);Left knee immobilizer  Activity Tolerance: Patient tolerated treatment well;Patient limited by pain;Patient limited by fatigue Patient left: in chair;with call bell/phone within reach  OT  Visit Diagnosis: Unsteadiness on feet (R26.81);Pain Pain - Right/Left: Left Pain - part of body: Knee;Leg                Time: [id number] OT Time Calculation (min): 8 min Charges:  OT General Charges $OT Visit: 1 Visit OT Evaluation $OT Eval Low Complexity: [place], OT Acute Rehabilitation Services Office: [phone]   Hortencia Pilar 04/01/2021, 12:33 PM

## 2021-04-01 NOTE — Progress Notes (Signed)
Physical Therapy Treatment Patient Details Name: Alicia Hughes MRN: [id number] DOB: 27-Feb-1955 Today's Date: 04/01/2021   History of Present Illness Alicia Hughes is an 66 y.o. female.who sustained displaced split depressed left tibial plateau fracture in a ground level fall. Under went surgical repair. PMHx: Anemia, GERD, R leg surgery from fracture, oral maxillofacial surgery    PT Comments    Pt received in supine, agreeable to therapy session and with good participation and tolerance for transfer and gait/stair training in room. Reviewed use of RW to safely ascend/descend single platform step in room similar to set-up at home. Pt reports spouse will use WC to ramp her up into home, but reviewed stair safety as pt reports she will step down using RW. Pt able to perform step up/down with minA (+2 for safety only) and needing mostly Supervision for gait and transfers. Pt mildly unsafe with stand>sit transfers so increased time discussing safer technique with demo, pt receptive. Pt able to doff hinged knee brace and reviewed PA restrictions on WB and ROMAT L knee, pt receptive. Pt continues to benefit from PT services to progress toward functional mobility goals.    Recommendations for follow up therapy are one component of a multi-disciplinary discharge planning process, led by the attending physician.  Recommendations may be updated based on patient status, additional functional criteria and insurance authorization.  Follow Up Recommendations  Home health PT     Assistance Recommended at Discharge Set up Supervision/Assistance  Patient can return home with the following A little help with walking and/or transfers;A little help with bathing/dressing/bathroom;Assistance with cooking/housework;Assist for transportation;Help with stairs or ramp for entrance   Equipment Recommendations  None recommended by PT    Recommendations for Other Services       Precautions / Restrictions  Precautions Precautions: Knee Precaution Booklet Issued: No Required Braces or Orthoses: Other Brace Knee Immobilizer - Left: On when out of bed or walking Other Brace: hinged knee brace Restrictions Weight Bearing Restrictions: Yes LLE Weight Bearing: Non weight bearing Other Position/Activity Restrictions: Unrestricted ROM L knee; Hinged knee brace only needs to be on when mobilizing     Mobility  Bed Mobility Overal bed mobility: Needs Assistance Bed Mobility: Supine to Sit     Supine to sit: Supervision     General bed mobility comments: use of bed features, did not need to use rail    Transfers Overall transfer level: Needs assistance Equipment used: Rolling walker (2 wheels) Transfers: Sit to/from Stand Sit to Stand: Min guard           General transfer comment: steadying assist. cues for technique with rw. to/from EOB and recliner heights, pt tending to sit prior to "good" R leg in close proximity to surfaces so verbal/visual review to reinforce safety, pt receptive.    Ambulation/Gait Ambulation/Gait assistance: Min guard, Supervision Gait Distance (Feet): 15 Feet (86ft, seated break, 15ft) Assistive device: Rolling walker (2 wheels) Gait Pattern/deviations: Step-to pattern, Trunk flexed (hop-to)       General Gait Details: Pt is non weightbearing on her left leg and was able to ambulate safely without putting her L foot on the ground.   Stairs Stairs: Yes Stairs assistance: Min assist, +2 safety/equipment Stair Management: Step to pattern, Forwards, Backwards, With walker Number of Stairs: 1 (x2 trials) General stair comments: visual/verbal review for technique, pt needs assist for RW stability and second person present for safety   Wheelchair Mobility    Modified Rankin (Stroke Patients Only)  Balance Overall balance assessment: Needs assistance Sitting-balance support: No upper extremity supported, Feet supported Sitting balance-Leahy  Scale: Fair     Standing balance support: Bilateral upper extremity supported, During functional activity, Reliant on assistive device for balance Standing balance-Leahy Scale: Fair Standing balance comment: fair balance using RW, pt somewhat unsafe at times recommend pt have spouse or another person present for all transfers and gait initially, reinforced this with pt                            Cognition Arousal/Alertness: Awake/alert Behavior During Therapy: WFL for tasks assessed/performed Overall Cognitive Status: Within Functional Limits for tasks assessed                                 General Comments: pt with some decreased safety awareness with transfers so took extra time to review this with her        Exercises      General Comments General comments (skin integrity, edema, etc.): TED hose donned, incision intact with minimal drainage, pt c/o discomfort from L hip bruising, no acute s/sx distress; prolonged instruction on brace donning/doffing and pt able to demo back      Pertinent Vitals/Pain Pain Assessment Pain Assessment: Faces Faces Pain Scale: Hurts little more Pain Location: L knee Pain Descriptors / Indicators: Grimacing, Guarding Pain Intervention(s): Monitored during session, Repositioned, Ice applied    Home Living Family/patient expects to be discharged to:: Private residence Living Arrangements: Spouse/significant other Available Help at Discharge: Family;Friend(s);Neighbor;Available 24 hours/day Type of Home: Mobile home Home Access: Stairs to enter;Ramped entrance Entrance Stairs-Rails: None Entrance Stairs-Number of Steps: 1   Home Layout: One level Home Equipment: Agricultural consultant (2 wheels);Rollator (4 wheels);Standard Walker;Cane - quad;BSC/3in1;Shower seat - built in      Prior Function            PT Goals (current goals can now be found in the care plan section) Acute Rehab PT Goals Patient Stated Goal: to go  home Progress towards PT goals: Progressing toward goals    Frequency    Min 5X/week      PT Plan Current plan remains appropriate    Co-evaluation              AM-PAC PT "6 Clicks" Mobility   Outcome Measure  Help needed turning from your back to your side while in a flat bed without using bedrails?: None Help needed moving from lying on your back to sitting on the side of a flat bed without using bedrails?: None Help needed moving to and from a bed to a chair (including a wheelchair)?: A Little Help needed standing up from a chair using your arms (e.g., wheelchair or bedside chair)?: A Little Help needed to walk in hospital room?: A Little Help needed climbing 3-5 steps with a railing? : A Lot (mod cues) 6 Click Score: 19    End of Session Equipment Utilized During Treatment: Gait belt;Other (comment) (LLE hinged knee brace) Activity Tolerance: Patient tolerated treatment well Patient left: in chair;with call bell/phone within reach;with chair alarm set Nurse Communication: Mobility status PT Visit Diagnosis: Other abnormalities of gait and mobility (R26.89);Muscle weakness (generalized) (M62.81);Pain Pain - Right/Left: Left Pain - part of body: Leg     Time: 4680-3212 PT Time Calculation (min) (ACUTE ONLY): 20 min  Charges:  $Gait Training: 8-22 mins  Florina Ou., PTA Acute Rehabilitation Services Pager: [phone] Office: [phone]    Angus Palms 04/01/2021, 1:00 PM

## 2021-04-01 NOTE — Discharge Summary (Signed)
Orthopaedic Trauma Service (OTS) Discharge Summary   Patient ID: Alicia EGLE MRN: [id number] DOB/AGE: 24-Jul-1955 66 y.o.  Admit date: 03/31/2021 Discharge date: 04/01/2021  Admission Diagnoses: Closed L tibial plateau fracture  GERD  Discharge Diagnoses:  Principal Problem:   Closed bicondylar fracture of left tibial plateau Active Problems:   Pathological fracture due to osteoporosis   GERD (gastroesophageal reflux disease)   Past Medical History:  Diagnosis Date   Anemia    GERD (gastroesophageal reflux disease)    Pathological fracture due to osteoporosis 04/01/2021     Procedures Performed: 03/31/2021- Dr. Carola Frost ORIF left tibial plateau  Anterior compartment fasciotomy   Discharged Condition: good  Hospital Course:   66 year old female admitted for ORIF of left tibial plateau.  This patient sustained a fracture from a ground-level fall approximately 2 weeks ago.  She was referred to the orthopedic trauma service from an outside orthopedist.  Surgical and nonsurgical treatment options were reviewed with the patient and she elected to proceed with ORIF.  Patient was brought to the operating room on 03/31/2021 for the procedure noted above.  After surgery she was transferred to the PACU for recovery from anesthesia and then transferred to the orthopedic floor for observation, pain control and therapies.  Patient's hospital stay was unremarkable.  She remained in the hospital overnight.  Her pain was well controlled on postoperative day #1 and she was mobilizing without any difficulty.  Patient was deemed stable for discharge on postoperative day #1.  We did check some basic bone health labs including vitamin D which is within normal limits.  Her bone quality is suboptimal and her fracture pattern is suggestive of fragility fracture.  She will be referred to osteoporosis clinic for further evaluation and discussion on management options.  Patient will follow-up with the  orthopedic trauma service in 10 to 14 days for follow-up x-rays and removal of her sutures.  She remained nonweightbearing on her left leg for 6 weeks.  She has unrestricted range of motion of her left knee and ankle.  Consults: None  Significant Diagnostic Studies: labs:    Latest Reference Range & Units 03/31/21 07:20 04/01/21 02:57 04/01/21 08:07  Glucose-Capillary 70 - 99 mg/dL   409 (H)  BASIC METABOLIC PANEL   Rpt !   COMPREHENSIVE METABOLIC PANEL  Rpt !    Sodium 135 - 145 mmol/L 143 138   Potassium 3.5 - 5.1 mmol/L 3.5 4.5   Chloride 98 - 111 mmol/L 104 103   CO2 22 - 32 mmol/L 29 29   Glucose 70 - 99 mg/dL 735 (H) 329 (H)   BUN 8 - 23 mg/dL 7 (L) 6 (L)   Creatinine 0.44 - 1.00 mg/dL 9.24 2.68   Calcium 8.9 - 10.3 mg/dL 9.3 8.7 (L)   Anion gap 5 - 15  10 6    Phosphorus 2.5 - 4.6 mg/dL 3.3    Magnesium 1.7 - 2.4 mg/dL 2.2    Alkaline Phosphatase 38 - 126 U/L 99    Albumin 3.5 - 5.0 g/dL 3.4 (L)    AST 15 - 41 U/L 27    ALT 0 - 44 U/L 33    Total Protein 6.5 - 8.1 g/dL 7.1    Total Bilirubin 0.3 - 1.2 mg/dL 0.7    PREALBUMIN 18 - 38 mg/dL    GFR, Estimated >60 mL/min >60 >60   Vitamin D, 25-Hydroxy 30 - 100 ng/mL 43.83    WBC 4.0 - 10.5 K/uL 9.7  13.3 (H)   RBC 3.87 - 5.11 MIL/uL 4.08 3.48 (L)   Hemoglobin 12.0 - 15.0 g/dL 42.3 95.3 (L)   HCT 20.2 - 46.0 % 39.4 33.7 (L)   MCV 80.0 - 100.0 fL 96.6 96.8   MCH 26.0 - 34.0 pg 30.9 31.0   MCHC 30.0 - 36.0 g/dL 33.4 35.6   RDW 86.1 - 15.5 % 13.7 14.0   Platelets 150 - 400 K/uL 391 352   nRBC 0.0 - 0.2 % 0.0 0.0   Neutrophils % 74    Lymphocytes % 17    Monocytes Relative % 7    Eosinophil % 1    Basophil % 1    Immature Granulocytes % 0    NEUT# 1.7 - 7.7 K/uL 7.3    Lymphocyte # 0.7 - 4.0 K/uL 1.6    Monocyte # 0.1 - 1.0 K/uL 0.7    Eosinophils Absolute 0.0 - 0.5 K/uL 0.1    Basophils Absolute 0.0 - 0.1 K/uL 0.1    Abs Immature Granulocytes 0.00 - 0.07 K/uL 0.03    Polychromasia  PRESENT    TSH 0.350 - 4.500  uIU/mL 2.564    (H): Data is abnormally high !: Data is abnormal (L): Data is abnormally low Rpt: View report in Results Review for more information  Treatments: IV hydration, antibiotics: Ancef, analgesia: acetaminophen and norco, anticoagulation: LMW heparin and xarelto at dc, therapies: PT, OT, and RN, and surgery: as above  Discharge Exam:                                Orthopaedic Trauma Service Progress Note   Patient ID: Alicia Hughes MRN: [id number] DOB/AGE: [birth year] 66 y.o.   Subjective:   Doing well No specific complaints  Pain controlled   Wants to go home today    Has all equipment she needs already    Reports no PCP No previous DEXA  Takes OTC vitamin d, calcium, magnesium  No other meds in past for osteoporosis  Mother and sister with osteoporosis    ROS As above   Objective:    VITALS:         Vitals:    03/31/21 1428 03/31/21 2210 04/01/21 0544 04/01/21 0827  BP:   104/65 117/66 109/61  Pulse: 97 99 94 (!) 102  Resp:   17 19 20   Temp:   98.2 F (36.8 C) 98.9 F (37.2 C) 98.1 F (36.7 C)  TempSrc:   Oral Oral Oral  SpO2: 95% 96% 98% 95%  Weight:          Height:              Estimated body mass index is 33.42 kg/m as calculated from the following:   Height as of this encounter: 5\' 8"  (1.727 m).   Weight as of this encounter: 99.7 kg.     Intake/Output      02/09 0701 02/10 0700 02/10 0701 02/11 0700   P.O. 360    I.V. (mL/kg) 1570 (15.7)    IV Piggyback 50    Total Intake(mL/kg) 1980 (19.9)    Urine (mL/kg/hr) 600 (0.3)    Blood 100    Total Output 700    Net +1280         Urine Occurrence 5 x       LABS   Lab Results Last 24 Hours       Results  for orders placed or performed during the hospital encounter of 03/31/21 (from the past 24 hour(s))  Basic metabolic panel     Status: Abnormal    Collection Time: 04/01/21  2:57 AM  Result Value Ref Range    Sodium 138 135 - 145 mmol/L    Potassium 4.5 3.5 - 5.1 mmol/L     Chloride 103 98 - 111 mmol/L    CO2 29 22 - 32 mmol/L    Glucose, Bld 116 (H) 70 - 99 mg/dL    BUN 6 (L) 8 - 23 mg/dL    Creatinine, Ser 4.01 0.44 - 1.00 mg/dL    Calcium 8.7 (L) 8.9 - 10.3 mg/dL    GFR, Estimated >02 >72 mL/min    Anion gap 6 5 - 15  CBC     Status: Abnormal    Collection Time: 04/01/21  2:57 AM  Result Value Ref Range    WBC 13.3 (H) 4.0 - 10.5 K/uL    RBC 3.48 (L) 3.87 - 5.11 MIL/uL    Hemoglobin 10.8 (L) 12.0 - 15.0 g/dL    HCT 53.6 (L) 64.4 - 46.0 %    MCV 96.8 80.0 - 100.0 fL    MCH 31.0 26.0 - 34.0 pg    MCHC 32.0 30.0 - 36.0 g/dL    RDW 03.4 74.2 - 59.5 %    Platelets 352 150 - 400 K/uL    nRBC 0.0 0.0 - 0.2 %  Glucose, capillary     Status: Abnormal    Collection Time: 04/01/21  8:07 AM  Result Value Ref Range    Glucose-Capillary 174 (H) 70 - 99 mg/dL        Latest Reference Range & Units 03/31/21 07:20  Vitamin D, 25-Hydroxy 30 - 100 ng/mL 43.83        PHYSICAL EXAM:    Gen: sitting up in bed, NAD, appears well, very pleasant  Lungs: CTA B  Cardiac: RRR Abd: + BS, NTND Ext:       Left Lower Extremity              Dressings stable             TED hose in place             Minimal swelling             Ext warm              + DP pulse             No DCT              Compartments are soft             DPN, SPN, TN sensation inact             Ankle flexion, extension, inversion and eversion are intact              Knee does not come in to full extension, nor does her contra-lateral side which also had a tibial plateau fracture in 2014                         She does not recall if her L knee went into full extension prior to this recent fracture    Assessment/Plan: 1 Day Post-Op    Principal Problem:   Closed bicondylar fracture of left tibial plateau Active Problems:   Pathological fracture due to osteoporosis  Anti-infectives (From admission, onward)        Start     Dose/Rate Route Frequency Ordered Stop    03/31/21 1430    ceFAZolin (ANCEF) IVPB 1 g/50 mL premix        1 g 100 mL/hr over 30 Minutes Intravenous Every 6 hours 03/31/21 1343 04/01/21 0238    03/31/21 0730   ceFAZolin (ANCEF) IVPB 2g/100 mL premix        2 g 200 mL/hr over 30 Minutes Intravenous On call to O.R. 03/31/21 1610 03/31/21 0841         .   POD/HD#: 20   66 year old female ground-level fall with left tibial plateau fracture   -fall   -Left bicondylar tibial plateau fracture with significant split depression of the lateral plateau s/p ORIF left tibial plateau             Nonweightbearing 6 weeks---> use walker for mobilization             Unrestricted range of motion left knee                         Hinged knee brace only needs to be on when mobilizing             Therapy evaluations             Dressing changes starting on 04/02/2021                         Can use 4 x 4 gauze and tape or silicone foam dressing such as a Mepilex.  Continue to use compression socks               Ice and elevate for swelling and pain control             Zero knee bone foam                          Patient is unsure of her left knee went into full extension prior to her injury.  She does have fairly significant arthritis so it is possible that she was lacking full extension.  Her contralateral side does not go into full extension               Follow-up with orthopedics in 10 to 14 days for follow-up x-rays and suture removal               May consider allowing her to get into the pool at the 4-week mark to begin some water-based weightbearing exercises   - Pain management:             Multimodal   - ABL anemia/Hemodynamics             Stable   - Medical issues              No reported chronic medical issues   - DVT/PE prophylaxis:             Xarelto 10 mg daily x 30 days - ID:              Periop abx    - Metabolic Bone Disease:             Fracture mechanism is suggestive of osteoporosis.  This is a fragility type fracture  Patient will need a bone density scan in 4 to 8 weeks.  We will refer her to osteoporosis clinic after obtaining DEXA scan.             Continue with vitamin D, calcium, magnesium   - Activity:             As above   - FEN/GI prophylaxis/Foley/Lines:             Reg diet             Dc IV and IVF   - Impediments to fracture healing:             Poor bone quality/osteoporosis    - Dispo:             Home today after therapies             Follow up with ortho in 10-14 days             Outpt referral for osteoporosis                             Disposition:   Discharge Instructions     Amb Referral to Osteoporosis Management    Complete by: As directed       Allergies as of 04/01/2021       Reactions   Shellfish Allergy Shortness Of Breath   shrimp   Codeine Nausea And Vomiting   Sulfa Antibiotics Rash, Other (See Comments)   burning        Medication List     TAKE these medications    acetaminophen 325 MG tablet Commonly known as: TYLENOL Take 1.5 tablets (487.5 mg total) by mouth every 12 (twelve) hours. What changed:  medication strength how much to take when to take this reasons to take this   aspirin EC 81 MG tablet Take 81 mg by mouth daily. Swallow whole.   CALCIUM PO Take 1 tablet by mouth every evening.   docusate sodium 100 MG capsule Commonly known as: COLACE Take 1 capsule (100 mg total) by mouth 2 (two) times daily.   ELDERBERRY PO Take 2 tablets by mouth in the morning.   famotidine 40 MG tablet Commonly known as: PEPCID Take 40 mg by mouth daily as needed for heartburn or indigestion.   HYDROcodone-acetaminophen 5-325 MG tablet Commonly known as: NORCO/VICODIN Take 1-2 tablets by mouth every 8 (eight) hours as needed for moderate pain or severe pain. What changed:  how much to take reasons to take this   IMMUNE FORMULA PO Take 1 capsule by mouth every evening. Immune 24 hr + Vitamin C   IRON PO Take 1 tablet by mouth  every evening.   MAGNESIUM PO Take 1 tablet by mouth every evening.   methocarbamol 750 MG tablet Commonly known as: ROBAXIN Take 1 tablet (750 mg total) by mouth every 8 (eight) hours as needed for muscle spasms.   multivitamin with minerals Tabs tablet Take 1 tablet by mouth daily. Centrum Multivitamin for Women 50+   rivaroxaban 10 MG Tabs tablet Commonly known as: XARELTO Take 1 tablet (10 mg total) by mouth daily.   Simply Saline 0.9 % Aers Generic drug: Saline Place 1-2 sprays into the nose as needed (allergies.).   VITAMIN B-12 PO Take 1 tablet by mouth every evening.   VITAMIN C GUMMIE PO Take 2 tablets by mouth in the morning.   VITAMIN D3 PO Take  1 tablet by mouth every evening.   ZINC PO Take 40 mg by mouth in the morning.        Follow-up Information     [facility].   Specialty: Home Health Services Why: home health services will be provided by [facility] , start of care within 48 hours post discharge Contact information: [address] [place] [id number] [phone]         Myrene GalasHandy, Michael, MD. Schedule an appointment as soon as possible for a visit in 2 week(s).   Specialty: Orthopedic Surgery Contact information: [address] [place] [address] [phone]                 Discharge Instructions and Plan:  66 y/o female s/p ground level fall with Left tibial plateau fracture s/p ORIF   Weightbearing: NWB LLE Insicional and dressing care: Daily dressing changes with 4x4 gauze and tape or silicone foam dressing (mepilex) Orthopedic device(s):  walker, zero knee bone foam, hinged knee brace Showering:  ok to shower and clean wounds with soap and water only  VTE prophylaxis:  xarelto 10 mg daily   x 30 days Pain control: multimodal: tylenol, percocet, robaxin Bone Health/Optimization: continue with vitamin d, calcium and magnesium.  Will refer to osteoporosis clinic. Will need dexa   Follow - up plan: 2 weeks Contact information:  Myrene GalasMichael Handy MD, Montez MoritaKeith Deeric Cruise PA-C   Signed:  Mearl LatinKeith W. Tranika Scholler, PA-C [phone] (C) 04/01/2021, 10:57 AM  [facility] [address] [place] [address] [phone] Collier Bullock(O) [phone] (F)

## 2021-04-01 NOTE — Progress Notes (Signed)
Nsg Discharge Note  Admit Date:  03/31/2021 Discharge date: 04/01/2021   Alicia Hughes to be D/C'd Home with [facility] per MD order.  AVS completed. Patient/caregiver able to verbalize understanding.  Discharge Medication: Allergies as of 04/01/2021       Reactions   Shellfish Allergy Shortness Of Breath   shrimp   Codeine Nausea And Vomiting   Sulfa Antibiotics Rash, Other (See Comments)   burning        Medication List     TAKE these medications    Acetaminophen Extra Strength 500 MG tablet Generic drug: acetaminophen Take 1 tablet (500 mg total) by mouth every 12 (twelve) hours. What changed:  how much to take when to take this reasons to take this   aspirin EC 81 MG tablet Take 81 mg by mouth daily. Swallow whole.   CALCIUM PO Take 1 tablet by mouth every evening.   docusate sodium 100 MG capsule Commonly known as: COLACE Take 1 capsule (100 mg total) by mouth 2 (two) times daily.   ELDERBERRY PO Take 2 tablets by mouth in the morning.   famotidine 40 MG tablet Commonly known as: PEPCID Take 40 mg by mouth daily as needed for heartburn or indigestion.   HYDROcodone-acetaminophen 5-325 MG tablet Commonly known as: NORCO/VICODIN Take 1-2 tablets by mouth every 8 (eight) hours as needed for moderate pain or severe pain. What changed:  how much to take reasons to take this   IMMUNE FORMULA PO Take 1 capsule by mouth every evening. Immune 24 hr + Vitamin C   IRON PO Take 1 tablet by mouth every evening.   MAGNESIUM PO Take 1 tablet by mouth every evening.   methocarbamol 750 MG tablet Commonly known as: ROBAXIN Take 1 tablet (750 mg total) by mouth every 8 (eight) hours as needed for muscle spasms.   multivitamin with minerals Tabs tablet Take 1 tablet by mouth daily. Centrum Multivitamin for Women 50+   Simply Saline 0.9 % Aers Generic drug: Saline Place 1-2 sprays into the nose as needed (allergies.).   VITAMIN B-12 PO Take 1 tablet by mouth every  evening.   VITAMIN C GUMMIE PO Take 2 tablets by mouth in the morning.   VITAMIN D3 PO Take 1 tablet by mouth every evening.   Xarelto 10 MG Tabs tablet Generic drug: rivaroxaban Take 1 tablet (10 mg total) by mouth daily.   ZINC PO Take 40 mg by mouth in the morning.               Discharge Care Instructions  (From admission, onward)           Start     Ordered   04/01/21 0000  Non weight bearing       Question Answer Comment  Laterality left   Extremity Lower      04/01/21 1128            Discharge Assessment: Vitals:   04/01/21 0544 04/01/21 0827  BP: 117/66 109/61  Pulse: 94 (!) 102  Resp: 19 20  Temp: 98.9 F (37.2 C) 98.1 F (36.7 C)  SpO2: 98% 95%   Skin clean, dry and intact without evidence of skin break down, no evidence of skin tears noted. IV catheter discontinued intact. Site without signs and symptoms of complications - no redness or edema noted at insertion site, patient denies c/o pain - only slight tenderness at site.  Dressing with slight pressure applied.  D/c Instructions-Education: Discharge instructions given  to patient/family with verbalized understanding. D/c education completed with patient/family including follow up instructions, medication list, d/c activities limitations if indicated, with other d/c instructions as indicated by MD - patient able to verbalize understanding, all questions fully answered. Patient instructed to return to ED, call 911, or call MD for any changes in condition.  Patient escorted via WC, and D/C home via private auto.  Kizzie Bane, RN 04/01/2021 1:46 PM

## 2021-04-02 LAB — PTH, INTACT AND CALCIUM
Calcium, Total (PTH): 9.3 mg/dL (ref 8.7–10.3)
PTH: 20 pg/mL (ref 15–65)

## 2021-04-04 ENCOUNTER — Other Ambulatory Visit (HOSPITAL_COMMUNITY): Payer: Self-pay

## 2021-04-05 DIAGNOSIS — Z9181 History of falling: Secondary | ICD-10-CM | POA: Diagnosis not present

## 2021-04-05 DIAGNOSIS — Z9851 Tubal ligation status: Secondary | ICD-10-CM | POA: Diagnosis not present

## 2021-04-05 DIAGNOSIS — Z7982 Long term (current) use of aspirin: Secondary | ICD-10-CM | POA: Diagnosis not present

## 2021-04-05 DIAGNOSIS — D649 Anemia, unspecified: Secondary | ICD-10-CM | POA: Diagnosis not present

## 2021-04-05 DIAGNOSIS — M80062D Age-related osteoporosis with current pathological fracture, left lower leg, subsequent encounter for fracture with routine healing: Secondary | ICD-10-CM | POA: Diagnosis not present

## 2021-04-05 DIAGNOSIS — K219 Gastro-esophageal reflux disease without esophagitis: Secondary | ICD-10-CM | POA: Diagnosis not present

## 2021-04-08 ENCOUNTER — Telehealth: Payer: Self-pay | Admitting: Pharmacist

## 2021-04-08 NOTE — Telephone Encounter (Signed)
Pharmacy Transitions of Care Follow-up Telephone Call  Date of discharge: 04/01/21  Discharge Diagnosis: DVT Prophylaxis  How have you been since you were released from the hospital? Patient has been well since hospital discharge. She is not in a lot of pain.   Medication changes made at discharge:  - START: Xarelto  - STOPPED: n/a  - CHANGED: n/a  Medication changes verified by the patient? Yes     Medication Accessibility:  Home Pharmacy: n/a   Was the patient provided with refills on discharged medications? No  Have all prescriptions been transferred from [facility] to home pharmacy? N/a   Is the patient able to afford medications? N/a Notable copays: n/a Eligible patient assistance: n/a    Medication Review: RIVAROXABAN (XARELTO)  Rivaroxaban 10mg  daily for 30 days.   - Discussed importance of taking medication with food and around the same time everyday  - Reviewed potential DDIs with patient  - Advised patient of medications to avoid (NSAIDs, ASA)  - Educated that Tylenol (acetaminophen) will be the preferred analgesic to prevent risk of bleeding  - Emphasized importance of monitoring for signs and symptoms of bleeding (abnormal bruising, prolonged bleeding, nose bleeds, bleeding from gums, discolored urine, black tarry stools)  - Advised patient to alert all providers of anticoagulation therapy prior to starting a new medication or having a procedure   Follow-up Appointments:  Specialist Hospital f/u appt confirmed? Yes Scheduled to see [facility] on 04/13/21 @ 9:30am.   If their condition worsens, is the pt aware to call PCP or go to the Emergency Dept.? yes  Final Patient Assessment: Patient is going well since her discharge. She will see specialist next week. Her pain is under control.

## 2021-04-13 DIAGNOSIS — S82145D Nondisplaced bicondylar fracture of left tibia, subsequent encounter for closed fracture with routine healing: Secondary | ICD-10-CM | POA: Diagnosis not present

## 2021-04-13 DIAGNOSIS — M24562 Contracture, left knee: Secondary | ICD-10-CM | POA: Diagnosis not present

## 2021-04-13 DIAGNOSIS — S82112D Displaced fracture of left tibial spine, subsequent encounter for closed fracture with routine healing: Secondary | ICD-10-CM | POA: Diagnosis not present

## 2021-04-13 DIAGNOSIS — S82122D Displaced fracture of lateral condyle of left tibia, subsequent encounter for closed fracture with routine healing: Secondary | ICD-10-CM | POA: Diagnosis not present

## 2021-04-22 DIAGNOSIS — Z9181 History of falling: Secondary | ICD-10-CM | POA: Diagnosis not present

## 2021-04-22 DIAGNOSIS — K219 Gastro-esophageal reflux disease without esophagitis: Secondary | ICD-10-CM | POA: Diagnosis not present

## 2021-04-22 DIAGNOSIS — D649 Anemia, unspecified: Secondary | ICD-10-CM | POA: Diagnosis not present

## 2021-04-22 DIAGNOSIS — M80062D Age-related osteoporosis with current pathological fracture, left lower leg, subsequent encounter for fracture with routine healing: Secondary | ICD-10-CM | POA: Diagnosis not present

## 2021-04-22 DIAGNOSIS — Z7982 Long term (current) use of aspirin: Secondary | ICD-10-CM | POA: Diagnosis not present

## 2021-04-22 DIAGNOSIS — Z9851 Tubal ligation status: Secondary | ICD-10-CM | POA: Diagnosis not present

## 2021-05-02 NOTE — Op Note (Signed)
NAME: Alicia Hughes ACCOUNT NO.: @ACCTNB @ DATE OF BIRTH: [birth year] FACILITY: [facility] LOCATION: [facility] PHYSICIAN:Denine Brotz H. Coutney Wildermuth, MD  OPERATIVE REPORT  DATE OF PROCEDURE:  03/31/2021  PREOPERATIVE DIAGNOSIS:   LEFT DEPRESSED LATERAL TIBIAL PLATEAU FRACTURE.  2. LEFT TIBIAL SPINE FRACTURE  POSTOPERATIVE DIAGNOSES:   1.  LEFT DEPRESSED LATERAL TIBIAL PLATEAU FRACTURE.   2.  LEFT TIBIAL SPINE FRACTURE 3.  LEFT LATERAL MENISCUS TEAR.  PROCEDURES: 1.  Open reduction internal fixation of LEFT lateral tibial plateau. 2.  Arthrotomy with repair of lateral meniscus peripheral tear. 3.  Anterior compartment fasciotomy. 4.  Open reduction internal fixation of LEFT tibial spine fracture.  SURGEON:  Altamese Terry, MD  ASSISTANT:  Ainsley Spinner, PA-C  ANESTHESIA:  General.  COMPLICATIONS:  None.  TOURNIQUET:  None.  ESTIMATED BLOOD LOSS:  100 mL.  SPECIMENS:  None.  DRAINS:  None.  DISPOSITION:  To PACU.  CONDITION:  Stable.  BRIEF SUMMARY AND INDICATIONS FOR PROCEDURE:  The patient is a very pleasant 66 y.o. who sustained tibial plateau fracture in fall resulting in swelling, pain, inability to bear weight.  Subsequent x-rays and CT scan demonstrated a lateral tibial plateau depression and was also associated with clinical instability in full extension on physical examination.  I discussed with the  patient risks and benefits of surgical repair, including the possibility of infection, nerve injury, vessel injury, DVT, PE, particularly given his medical history, as well as malunion, nonunion, symptomatic hardware, heart attack, stroke and other  complications.  After acknowledging these risks, the patient provided consent to proceed.  BRIEF SUMMARY OF PROCEDURE:  The patient was taken to the operating room where general anesthesia was induced.  The operative lower extremity was prepped and draped in the usual sterile fashion with chlorhexidine wash, then  Betadine scrub and paint.  I made a curvilinear incision over Gerdy's tubercle.  The retinaculum was incised proximal to the joint and then the coronary ligament incised along its base and the knee swung into varus to open up the lateral compartment for visibility. The lateral meniscus was found to be torn at the periphery but was in surprisingly good condition for age otherwise.  Prolene sutures were passed in vertical mattress technique through the retinaculum and the edge of the lateral meniscus.  This revealed significant depression, particularly anteriorly and laterally.  I then traced the fracture line into the metaphysis and booked open the fracture site with a posteriorly based hinge.  This was opened up and the bone tamp used in sequential fashion supplemented with both fluoroscopy and direct visualization to get the entirety of the joint elevated to the appropriate height and to secure the eminence fragment such that it could be compressed into a stable position at appropriate height.  This was secured initially with multiple K wires and then the Biomet ALPS plate applied laterally.  Standard screws were used initially in the top level in the most anterior and posterior holes and then lock fixation was used for the remainder.  One distal standard screw was placed at the distal edge of the plate prior to beginning placement of any of the locked fixation.  AP and lateral views showed outstanding reduction and overall knee alignment.  It should be noted that prior to closing 20 mL of cancellous chips were impacted into this area below the subchondral bone, which had been elevated into a reduced position.  Lastly, the long Metzenbaum scissors were used to spread superficial and deep to the  anterior compartment fascia and then the scissors were passed 10 cm along the fascia to perform an anterior compartment release to reduce the risk of postoperative compartment syndrome or other complications.  All wounds  were irrigated thoroughly and then closed in standard layered fashion using 0 Prolene vertical mattress for coronary multi-ligament repair, #1 Vicryl for the retinaculum, 2-0 Vicryl and 2-0 nylon for the subcutaneous and skin.  Sterile gently compressive dressing was applied and a knee immobilizer.  The patient was awakened from anesthesia and transported to the PACU in stable condition.  Ainsley Spinner, PA-C, was present and assisting throughout.  An assistant was absolutely necessary to control the leg for inspection, treatment of a meniscal tear, as well as reduction and provisional definitive fixation of the plateau.  He also assisted with the compartment fasciotomy and closure.  PROGNOSIS:  The patient will have unrestricted range of motion, but will be strictly nonweightbearing for the next 6 weeks with graduated weightbearing thereafter.  She will be on formal DVT prophylaxis; ice, elevate, contact us immediately with any concerns.  Mobilize as much as possible as well; and return to the office in 2 weeks for removal of sutures.

## 2021-05-11 DIAGNOSIS — S82145D Nondisplaced bicondylar fracture of left tibia, subsequent encounter for closed fracture with routine healing: Secondary | ICD-10-CM | POA: Diagnosis not present

## 2021-05-11 DIAGNOSIS — S82122D Displaced fracture of lateral condyle of left tibia, subsequent encounter for closed fracture with routine healing: Secondary | ICD-10-CM | POA: Diagnosis not present

## 2021-05-11 DIAGNOSIS — M24562 Contracture, left knee: Secondary | ICD-10-CM | POA: Diagnosis not present

## 2021-05-11 DIAGNOSIS — S82112D Displaced fracture of left tibial spine, subsequent encounter for closed fracture with routine healing: Secondary | ICD-10-CM | POA: Diagnosis not present

## 2021-06-01 DIAGNOSIS — S82145 Nondisplaced bicondylar fracture of left tibia: Secondary | ICD-10-CM | POA: Diagnosis not present

## 2021-06-01 DIAGNOSIS — S83282D Other tear of lateral meniscus, current injury, left knee, subsequent encounter: Secondary | ICD-10-CM | POA: Diagnosis not present

## 2021-06-01 DIAGNOSIS — S82112D Displaced fracture of left tibial spine, subsequent encounter for closed fracture with routine healing: Secondary | ICD-10-CM | POA: Diagnosis not present

## 2021-06-01 DIAGNOSIS — S82122D Displaced fracture of lateral condyle of left tibia, subsequent encounter for closed fracture with routine healing: Secondary | ICD-10-CM | POA: Diagnosis not present

## 2021-06-01 DIAGNOSIS — S82145D Nondisplaced bicondylar fracture of left tibia, subsequent encounter for closed fracture with routine healing: Secondary | ICD-10-CM | POA: Diagnosis not present

## 2021-06-02 DIAGNOSIS — M80062D Age-related osteoporosis with current pathological fracture, left lower leg, subsequent encounter for fracture with routine healing: Secondary | ICD-10-CM | POA: Diagnosis not present

## 2021-06-02 DIAGNOSIS — Z9181 History of falling: Secondary | ICD-10-CM | POA: Diagnosis not present

## 2021-06-02 DIAGNOSIS — D649 Anemia, unspecified: Secondary | ICD-10-CM | POA: Diagnosis not present

## 2021-06-02 DIAGNOSIS — Z7982 Long term (current) use of aspirin: Secondary | ICD-10-CM | POA: Diagnosis not present

## 2021-06-02 DIAGNOSIS — Z9851 Tubal ligation status: Secondary | ICD-10-CM | POA: Diagnosis not present

## 2021-06-02 DIAGNOSIS — K219 Gastro-esophageal reflux disease without esophagitis: Secondary | ICD-10-CM | POA: Diagnosis not present

## 2021-06-10 DIAGNOSIS — M25562 Pain in left knee: Secondary | ICD-10-CM | POA: Diagnosis not present

## 2021-06-14 DIAGNOSIS — M25562 Pain in left knee: Secondary | ICD-10-CM | POA: Diagnosis not present

## 2021-06-16 DIAGNOSIS — M25562 Pain in left knee: Secondary | ICD-10-CM | POA: Diagnosis not present

## 2021-06-21 DIAGNOSIS — M25562 Pain in left knee: Secondary | ICD-10-CM | POA: Diagnosis not present

## 2021-06-23 DIAGNOSIS — M25562 Pain in left knee: Secondary | ICD-10-CM | POA: Diagnosis not present

## 2021-06-28 DIAGNOSIS — M25562 Pain in left knee: Secondary | ICD-10-CM | POA: Diagnosis not present

## 2021-06-29 DIAGNOSIS — S82112D Displaced fracture of left tibial spine, subsequent encounter for closed fracture with routine healing: Secondary | ICD-10-CM | POA: Diagnosis not present

## 2021-06-29 DIAGNOSIS — S82122D Displaced fracture of lateral condyle of left tibia, subsequent encounter for closed fracture with routine healing: Secondary | ICD-10-CM | POA: Diagnosis not present

## 2021-06-29 DIAGNOSIS — S83282D Other tear of lateral meniscus, current injury, left knee, subsequent encounter: Secondary | ICD-10-CM | POA: Diagnosis not present

## 2021-06-29 DIAGNOSIS — S82145D Nondisplaced bicondylar fracture of left tibia, subsequent encounter for closed fracture with routine healing: Secondary | ICD-10-CM | POA: Diagnosis not present

## 2021-07-01 DIAGNOSIS — M25562 Pain in left knee: Secondary | ICD-10-CM | POA: Diagnosis not present

## 2021-07-03 DIAGNOSIS — H6982 Other specified disorders of Eustachian tube, left ear: Secondary | ICD-10-CM | POA: Diagnosis not present

## 2021-07-03 DIAGNOSIS — J309 Allergic rhinitis, unspecified: Secondary | ICD-10-CM | POA: Diagnosis not present

## 2021-07-05 DIAGNOSIS — M25562 Pain in left knee: Secondary | ICD-10-CM | POA: Diagnosis not present

## 2021-07-07 DIAGNOSIS — M25562 Pain in left knee: Secondary | ICD-10-CM | POA: Diagnosis not present

## 2021-07-12 DIAGNOSIS — M25562 Pain in left knee: Secondary | ICD-10-CM | POA: Diagnosis not present

## 2021-07-14 DIAGNOSIS — M25562 Pain in left knee: Secondary | ICD-10-CM | POA: Diagnosis not present

## 2021-07-19 DIAGNOSIS — M25562 Pain in left knee: Secondary | ICD-10-CM | POA: Diagnosis not present

## 2021-07-21 DIAGNOSIS — M25562 Pain in left knee: Secondary | ICD-10-CM | POA: Diagnosis not present

## 2021-07-26 DIAGNOSIS — M25562 Pain in left knee: Secondary | ICD-10-CM | POA: Diagnosis not present

## 2021-07-28 DIAGNOSIS — M25562 Pain in left knee: Secondary | ICD-10-CM | POA: Diagnosis not present

## 2021-08-02 DIAGNOSIS — M25562 Pain in left knee: Secondary | ICD-10-CM | POA: Diagnosis not present

## 2021-08-04 DIAGNOSIS — M25562 Pain in left knee: Secondary | ICD-10-CM | POA: Diagnosis not present

## 2021-08-09 DIAGNOSIS — M25562 Pain in left knee: Secondary | ICD-10-CM | POA: Diagnosis not present

## 2021-08-11 DIAGNOSIS — M25562 Pain in left knee: Secondary | ICD-10-CM | POA: Diagnosis not present

## 2021-08-17 DIAGNOSIS — S82122 Displaced fracture of lateral condyle of left tibia: Secondary | ICD-10-CM | POA: Diagnosis not present

## 2021-08-17 DIAGNOSIS — S82112D Displaced fracture of left tibial spine, subsequent encounter for closed fracture with routine healing: Secondary | ICD-10-CM | POA: Diagnosis not present

## 2021-08-17 DIAGNOSIS — S82145D Nondisplaced bicondylar fracture of left tibia, subsequent encounter for closed fracture with routine healing: Secondary | ICD-10-CM | POA: Diagnosis not present

## 2021-08-17 DIAGNOSIS — S83282D Other tear of lateral meniscus, current injury, left knee, subsequent encounter: Secondary | ICD-10-CM | POA: Diagnosis not present

## 2021-11-16 DIAGNOSIS — S82112D Displaced fracture of left tibial spine, subsequent encounter for closed fracture with routine healing: Secondary | ICD-10-CM | POA: Diagnosis not present

## 2021-11-16 DIAGNOSIS — S82122D Displaced fracture of lateral condyle of left tibia, subsequent encounter for closed fracture with routine healing: Secondary | ICD-10-CM | POA: Diagnosis not present

## 2021-11-16 DIAGNOSIS — S83282D Other tear of lateral meniscus, current injury, left knee, subsequent encounter: Secondary | ICD-10-CM | POA: Diagnosis not present

## 2021-11-16 DIAGNOSIS — S82145D Nondisplaced bicondylar fracture of left tibia, subsequent encounter for closed fracture with routine healing: Secondary | ICD-10-CM | POA: Diagnosis not present

## 2021-12-22 DIAGNOSIS — H2513 Age-related nuclear cataract, bilateral: Secondary | ICD-10-CM | POA: Diagnosis not present

## 2021-12-22 DIAGNOSIS — H5213 Myopia, bilateral: Secondary | ICD-10-CM | POA: Diagnosis not present

## 2023-07-09 IMAGING — DX DG KNEE 1-2V PORT*L*
2 series · 2 of 2 positions shown · non-contrast
Comparison: Left tibia and fibula radiographs 03/20/2021

CLINICAL DATA: Status post left tibial plateau fracture repair.

EXAM:
PORTABLE LEFT KNEE - 1-2 VIEW

[knee ap]
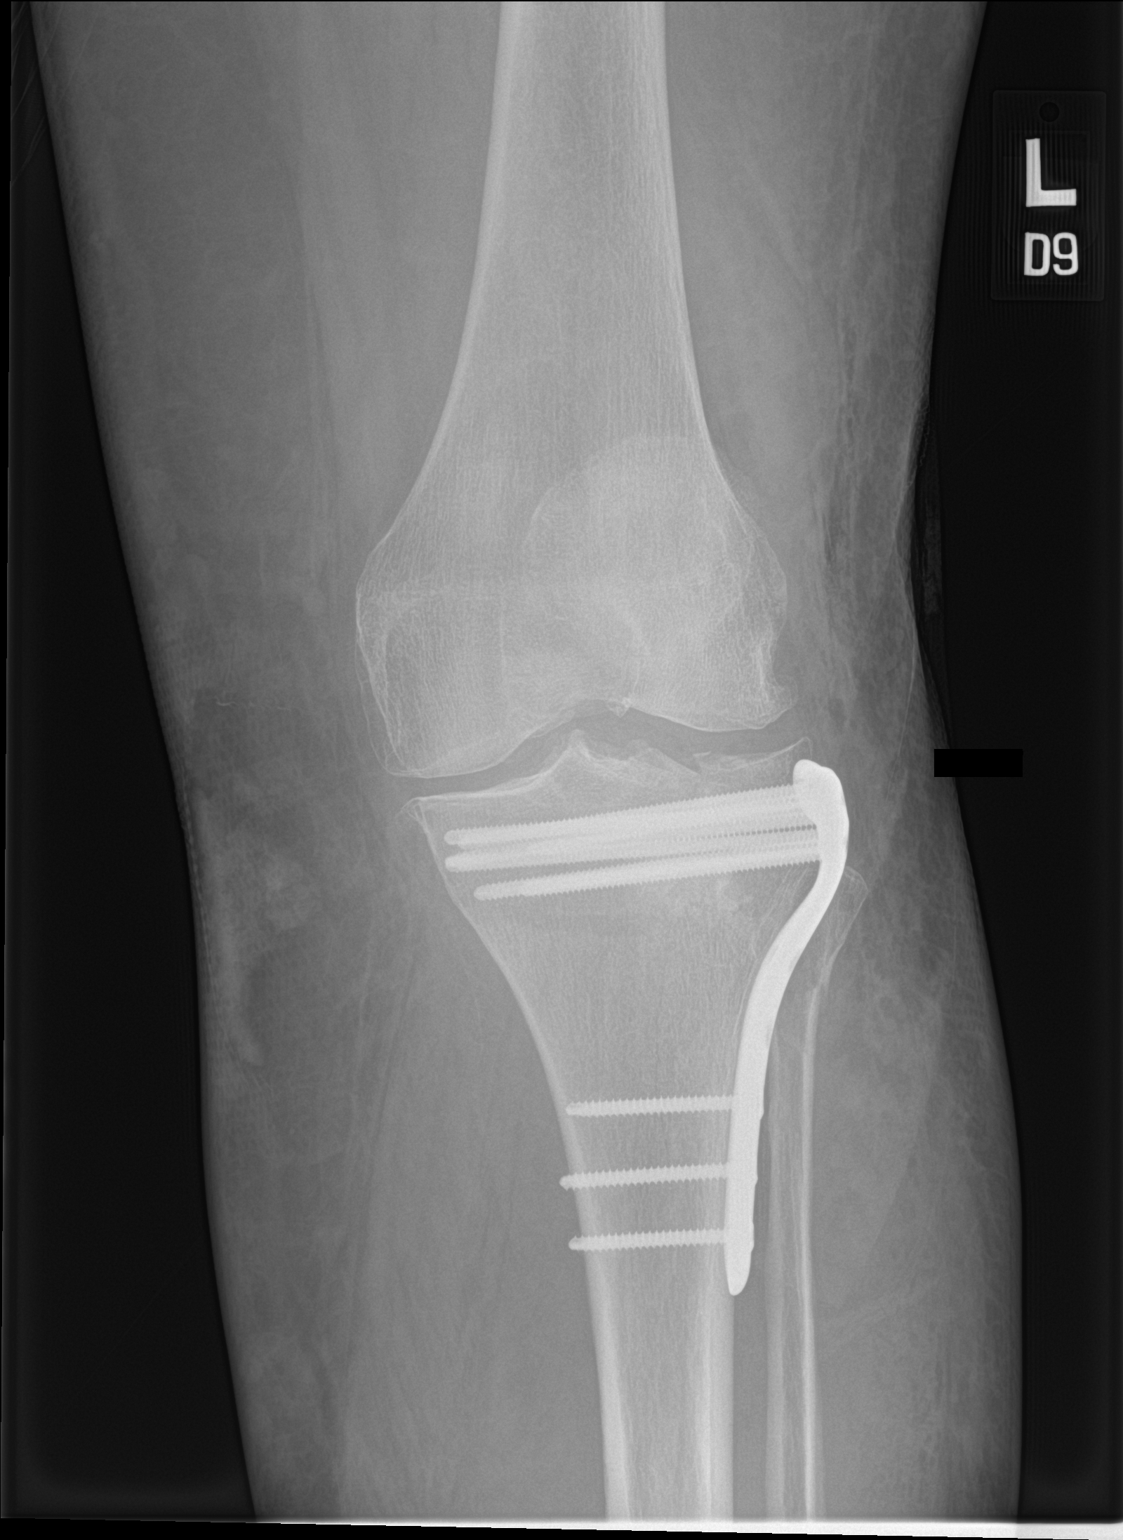

[knee lat]
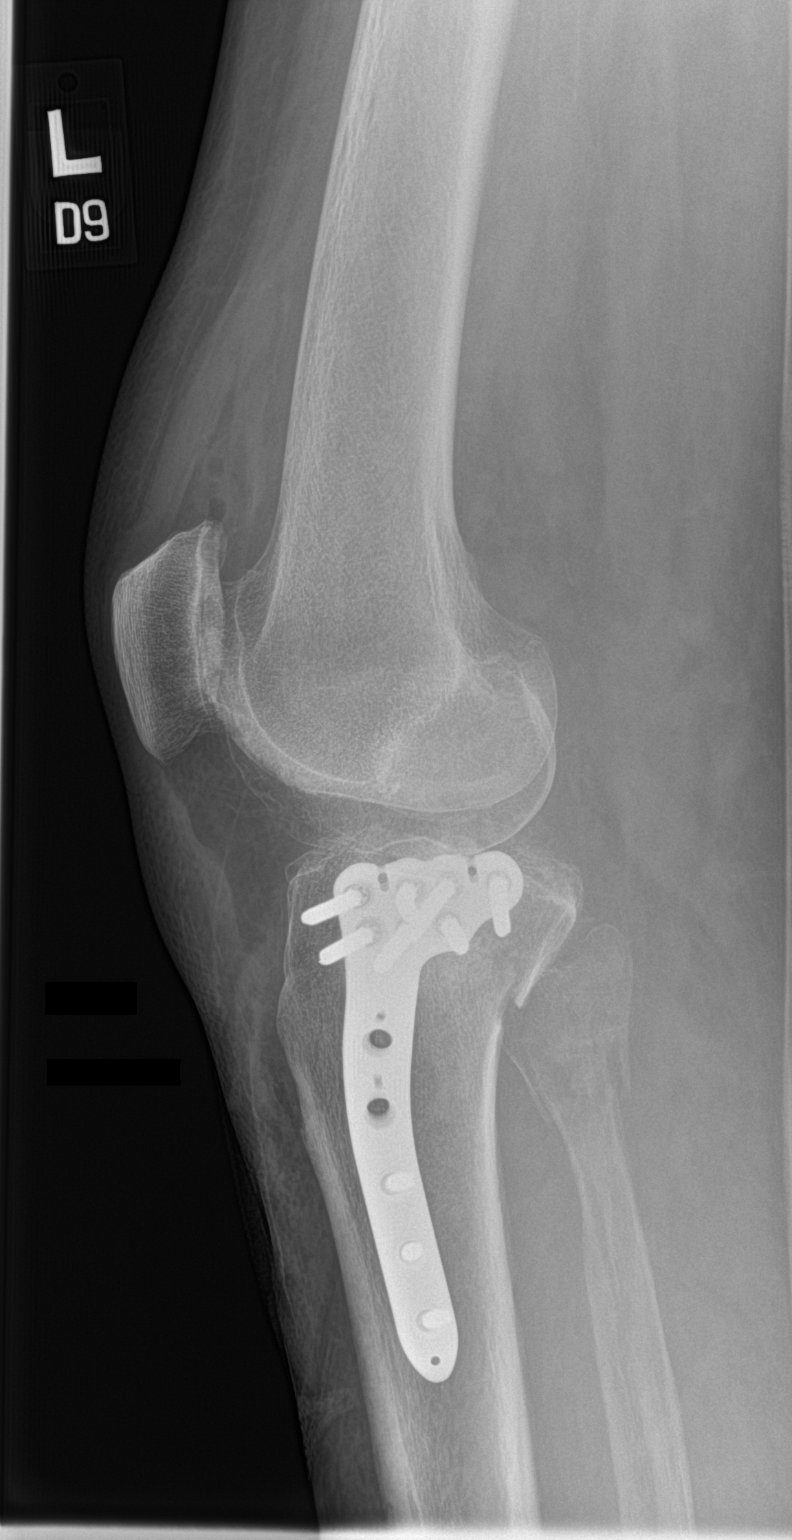

[2 of 2 positions shown; findings below may reference images not displayed]

FINDINGS: Interval lateral plate and screw fixation of the previously seen
depressed lateral tibial plateau fracture. There are persistent
fracture line lucencies. Mild proximal posterior tibial metaphyseal
3 mm posterior cortical step-off. Overall improved, now near
anatomic alignment. Comminuted, nondisplaced acute fracture of the
proximal fibular metaphysis, similar to prior. Small joint effusion.
Likely mild postoperative intra-articular air. Severe patellofemoral
joint space narrowing with mild peripheral degenerative
osteophytosis.
IMPRESSION: Interval ORIF of the proximal lateral tibia with improved alignment.

## 2023-07-09 IMAGING — RF DG KNEE 1-2V*L*
1 series · 2 of 2 positions shown · non-contrast
Comparison: 03/10/2021

CLINICAL DATA: ORIF left tibial plateau fracture

EXAM:
LEFT KNEE - 1-2 VIEW

[Series 1: run · 2 of 2 slices shown]
[im 1/2]
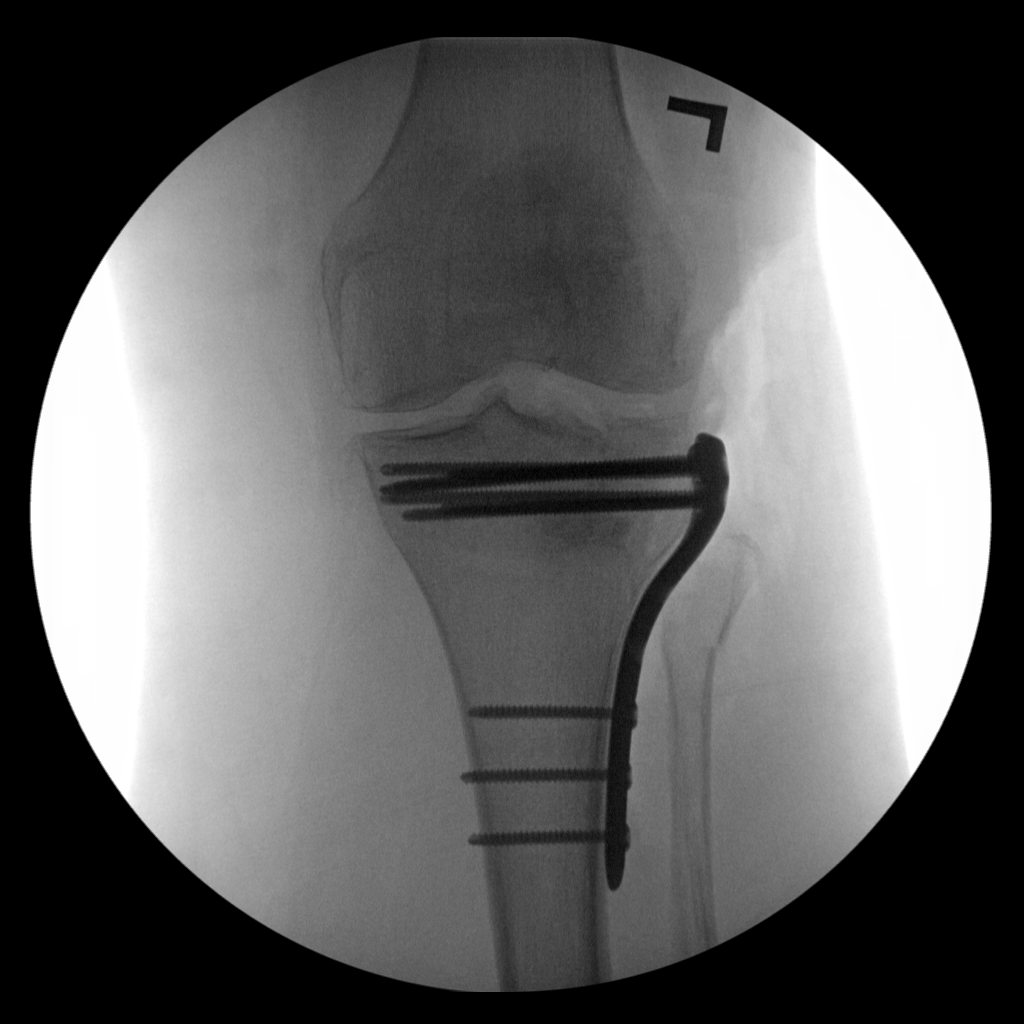
[im 2/2]
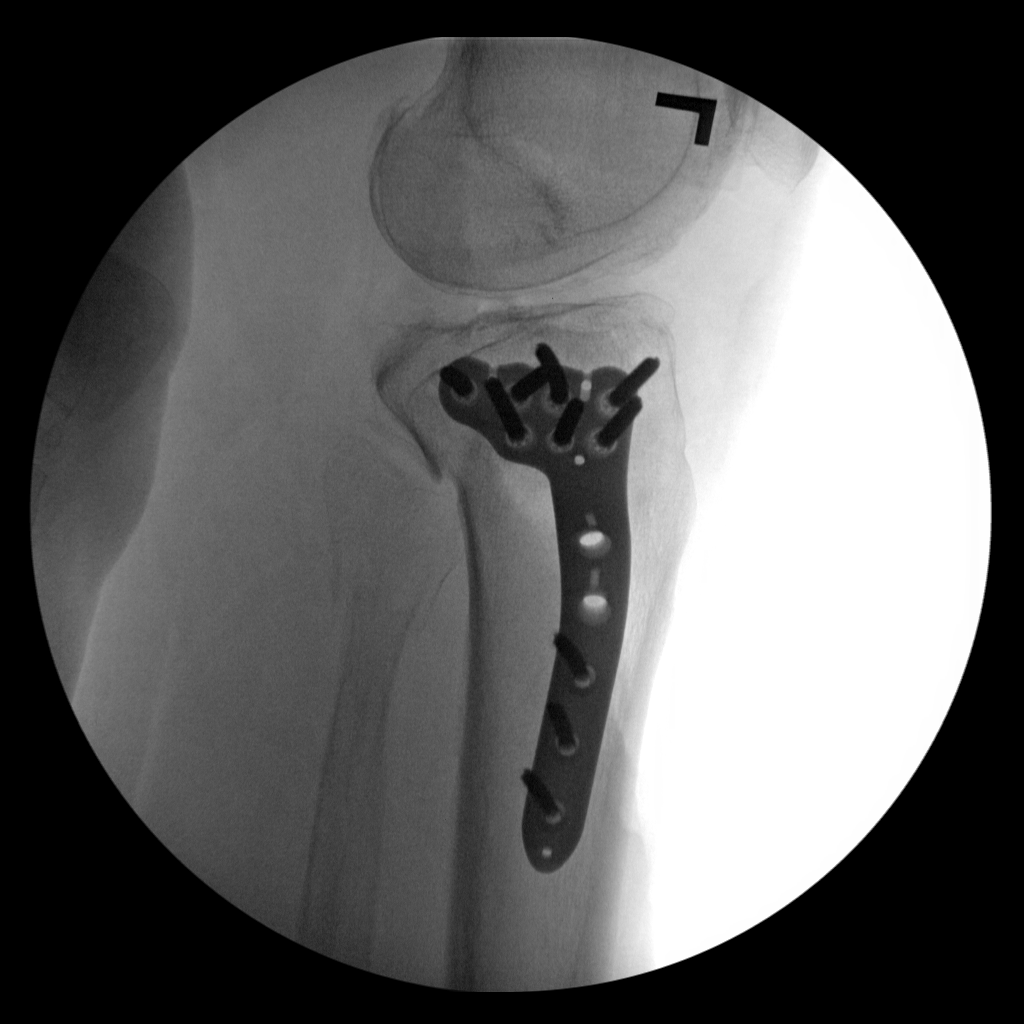

[2 of 2 positions shown; findings below may reference images not displayed]

FINDINGS: Two C-arm images left knee were obtained. Lateral tibial plateau
fracture has been elevated and fixated with lateral plate and
screws. Fracture extends into the lateral joint compartment of the
knee. Improved alignment

Fracture proximal fibula with mild displacement. No fixation of the
fibula.
IMPRESSION: Lateral plate and screw fixation of lateral tibial plateau fracture
